# Patient Record
Sex: Female | Born: 1980 | Race: White | Hispanic: No | Marital: Married | Smoking: Never smoker
Health system: Southern US, Community
[De-identification: ages and names within clinical notes are randomized; demographics above are authoritative.]

## PROBLEM LIST (undated history)

## (undated) DIAGNOSIS — F909 Attention-deficit hyperactivity disorder, unspecified type: Secondary | ICD-10-CM

## (undated) DIAGNOSIS — O34219 Maternal care for unspecified type scar from previous cesarean delivery: Secondary | ICD-10-CM

## (undated) DIAGNOSIS — F419 Anxiety disorder, unspecified: Secondary | ICD-10-CM

## (undated) DIAGNOSIS — D649 Anemia, unspecified: Secondary | ICD-10-CM

## (undated) DIAGNOSIS — I493 Ventricular premature depolarization: Secondary | ICD-10-CM

## (undated) HISTORY — PX: WISDOM TOOTH EXTRACTION: SHX21

## (undated) HISTORY — PX: DILATION AND CURETTAGE OF UTERUS: SHX78

---

## 1999-02-12 ENCOUNTER — Other Ambulatory Visit: Admission: RE | Admit: 1999-02-12 | Discharge: 1999-02-12 | Payer: Self-pay | Admitting: Obstetrics and Gynecology

## 2003-11-24 ENCOUNTER — Emergency Department (HOSPITAL_COMMUNITY): Admission: EM | Admit: 2003-11-24 | Discharge: 2003-11-24 | Payer: Self-pay | Admitting: Emergency Medicine

## 2008-07-01 ENCOUNTER — Inpatient Hospital Stay (HOSPITAL_COMMUNITY): Admission: RE | Admit: 2008-07-01 | Discharge: 2008-07-04 | Payer: Self-pay | Admitting: Obstetrics and Gynecology

## 2010-04-11 ENCOUNTER — Inpatient Hospital Stay (HOSPITAL_COMMUNITY): Admission: AD | Admit: 2010-04-11 | Discharge: 2010-04-11 | Payer: Self-pay | Admitting: Obstetrics and Gynecology

## 2010-04-12 ENCOUNTER — Ambulatory Visit (HOSPITAL_COMMUNITY): Admission: AD | Admit: 2010-04-12 | Discharge: 2010-04-12 | Payer: Self-pay | Admitting: Obstetrics and Gynecology

## 2010-04-28 DEATH — deceased

## 2010-09-09 LAB — CBC
HCT: 39.8 % (ref 36.0–46.0)
Hemoglobin: 13.7 g/dL (ref 12.0–15.0)
MCH: 31.5 pg (ref 26.0–34.0)
MCHC: 34.3 g/dL (ref 30.0–36.0)
MCV: 91.8 fL (ref 78.0–100.0)
Platelets: 189 10*3/uL (ref 150–400)
RBC: 4.34 MIL/uL (ref 3.87–5.11)
RDW: 11.9 % (ref 11.5–15.5)
WBC: 6.4 10*3/uL (ref 4.0–10.5)

## 2010-09-09 LAB — WET PREP, GENITAL
Clue Cells Wet Prep HPF POC: NONE SEEN
Trich, Wet Prep: NONE SEEN
Yeast Wet Prep HPF POC: NONE SEEN

## 2010-09-09 LAB — GC/CHLAMYDIA PROBE AMP, GENITAL
Chlamydia, DNA Probe: NEGATIVE
GC Probe Amp, Genital: NEGATIVE

## 2010-09-09 LAB — ABO/RH: ABO/RH(D): O POS

## 2010-10-12 LAB — CBC
HCT: 30.3 % — ABNORMAL LOW (ref 36.0–46.0)
HCT: 34.7 % — ABNORMAL LOW (ref 36.0–46.0)
Hemoglobin: 10.2 g/dL — ABNORMAL LOW (ref 12.0–15.0)
Hemoglobin: 11.8 g/dL — ABNORMAL LOW (ref 12.0–15.0)
MCHC: 33.8 g/dL (ref 30.0–36.0)
MCHC: 33.9 g/dL (ref 30.0–36.0)
MCV: 91.9 fL (ref 78.0–100.0)
MCV: 93.6 fL (ref 78.0–100.0)
Platelets: 175 10*3/uL (ref 150–400)
Platelets: 207 10*3/uL (ref 150–400)
RBC: 3.24 MIL/uL — ABNORMAL LOW (ref 3.87–5.11)
RBC: 3.78 MIL/uL — ABNORMAL LOW (ref 3.87–5.11)
RDW: 13.1 % (ref 11.5–15.5)
RDW: 13.4 % (ref 11.5–15.5)
WBC: 12.9 10*3/uL — ABNORMAL HIGH (ref 4.0–10.5)
WBC: 7.8 10*3/uL (ref 4.0–10.5)

## 2010-10-12 LAB — RPR: RPR Ser Ql: NONREACTIVE

## 2010-12-23 LAB — ABO/RH: RH Type: POSITIVE

## 2010-12-23 LAB — GC/CHLAMYDIA PROBE AMP, GENITAL
Chlamydia: NEGATIVE
Gonorrhea: NEGATIVE

## 2010-12-23 LAB — HIV ANTIBODY (ROUTINE TESTING W REFLEX): HIV: NONREACTIVE

## 2010-12-23 LAB — RUBELLA ANTIBODY, IGM: Rubella: IMMUNE

## 2011-06-09 ENCOUNTER — Ambulatory Visit (INDEPENDENT_AMBULATORY_CARE_PROVIDER_SITE_OTHER): Payer: Commercial Managed Care - PPO | Admitting: *Deleted

## 2011-06-09 ENCOUNTER — Encounter (HOSPITAL_COMMUNITY): Payer: Self-pay

## 2011-06-09 ENCOUNTER — Inpatient Hospital Stay (HOSPITAL_COMMUNITY)
Admission: AD | Admit: 2011-06-09 | Discharge: 2011-06-09 | Disposition: A | Discharge status: Home / Self Care | Payer: Commercial Managed Care - PPO | Source: Ambulatory Visit | Attending: Obstetrics and Gynecology | Admitting: Obstetrics and Gynecology

## 2011-06-09 VITALS — BP 122/59

## 2011-06-09 DIAGNOSIS — O4700 False labor before 37 completed weeks of gestation, unspecified trimester: Secondary | ICD-10-CM | POA: Insufficient documentation

## 2011-06-09 DIAGNOSIS — O30009 Twin pregnancy, unspecified number of placenta and unspecified number of amniotic sacs, unspecified trimester: Secondary | ICD-10-CM

## 2011-06-09 HISTORY — DX: Anemia, unspecified: D64.9

## 2011-06-09 MED ORDER — BETAMETHASONE SOD PHOS & ACET 6 (3-3) MG/ML IJ SUSP
12.0000 mg | Freq: Once | INTRAMUSCULAR | Status: DC
  Filled 2011-06-09: qty 2

## 2011-06-09 MED ORDER — BETAMETHASONE SOD PHOS & ACET 6 (3-3) MG/ML IJ SUSP
12.0000 mg | Freq: Once | INTRAMUSCULAR | Status: AC
  Administered 2011-06-09: 12 mg via INTRAMUSCULAR
  Filled 2011-06-09: qty 2

## 2011-06-09 NOTE — Progress Notes (Signed)
Pt states here for betamethasone inj only, denies pain, bleeding or lof. +FM.

## 2011-06-09 NOTE — Progress Notes (Signed)
P = 88   Pt has appt w/ Dr. Renaldo Fiddler following NST today

## 2011-06-10 ENCOUNTER — Inpatient Hospital Stay (HOSPITAL_COMMUNITY)
Admission: AD | Admit: 2011-06-10 | Discharge: 2011-06-10 | Disposition: A | Discharge status: Home / Self Care | Payer: Commercial Managed Care - PPO | Source: Ambulatory Visit | Attending: Obstetrics and Gynecology | Admitting: Obstetrics and Gynecology

## 2011-06-10 DIAGNOSIS — O4700 False labor before 37 completed weeks of gestation, unspecified trimester: Secondary | ICD-10-CM | POA: Insufficient documentation

## 2011-06-10 MED ORDER — BETAMETHASONE SOD PHOS & ACET 6 (3-3) MG/ML IJ SUSP
12.0000 mg | Freq: Once | INTRAMUSCULAR | Status: AC
  Administered 2011-06-10: 12 mg via INTRAMUSCULAR
  Filled 2011-06-10: qty 2

## 2011-06-16 ENCOUNTER — Ambulatory Visit (INDEPENDENT_AMBULATORY_CARE_PROVIDER_SITE_OTHER): Payer: Commercial Managed Care - PPO | Admitting: *Deleted

## 2011-06-16 VITALS — BP 120/64

## 2011-06-16 DIAGNOSIS — O30009 Twin pregnancy, unspecified number of placenta and unspecified number of amniotic sacs, unspecified trimester: Secondary | ICD-10-CM

## 2011-06-16 NOTE — Progress Notes (Signed)
P = 84  Pt to see Dr. Renaldo Fiddler following NST today.

## 2011-06-23 ENCOUNTER — Encounter (HOSPITAL_COMMUNITY): Payer: Self-pay

## 2011-06-23 ENCOUNTER — Encounter (HOSPITAL_COMMUNITY)
Admission: AD | Disposition: A | Discharge status: Home / Self Care | Payer: Self-pay | Source: Ambulatory Visit | Attending: Obstetrics and Gynecology

## 2011-06-23 ENCOUNTER — Inpatient Hospital Stay (HOSPITAL_COMMUNITY)
Admission: AD | Admit: 2011-06-23 | Discharge: 2011-06-27 | DRG: 765 | Disposition: A | Discharge status: Home / Self Care | Payer: Commercial Managed Care - PPO | Source: Ambulatory Visit | Attending: Obstetrics and Gynecology | Admitting: Obstetrics and Gynecology

## 2011-06-23 ENCOUNTER — Encounter (HOSPITAL_COMMUNITY): Payer: Self-pay | Admitting: *Deleted

## 2011-06-23 ENCOUNTER — Ambulatory Visit (INDEPENDENT_AMBULATORY_CARE_PROVIDER_SITE_OTHER): Payer: Commercial Managed Care - PPO | Admitting: *Deleted

## 2011-06-23 ENCOUNTER — Other Ambulatory Visit: Payer: Self-pay | Admitting: Obstetrics and Gynecology

## 2011-06-23 ENCOUNTER — Inpatient Hospital Stay (HOSPITAL_COMMUNITY): Payer: Commercial Managed Care - PPO

## 2011-06-23 DIAGNOSIS — O30009 Twin pregnancy, unspecified number of placenta and unspecified number of amniotic sacs, unspecified trimester: Secondary | ICD-10-CM

## 2011-06-23 DIAGNOSIS — L02219 Cutaneous abscess of trunk, unspecified: Secondary | ICD-10-CM | POA: Diagnosis not present

## 2011-06-23 DIAGNOSIS — O3090 Multiple gestation, unspecified, unspecified trimester: Principal | ICD-10-CM | POA: Diagnosis present

## 2011-06-23 DIAGNOSIS — O909 Complication of the puerperium, unspecified: Secondary | ICD-10-CM | POA: Diagnosis not present

## 2011-06-23 DIAGNOSIS — Z98891 History of uterine scar from previous surgery: Secondary | ICD-10-CM

## 2011-06-23 LAB — CBC
HCT: 34.7 % — ABNORMAL LOW (ref 36.0–46.0)
MCH: 30 pg (ref 26.0–34.0)
MCHC: 33.4 g/dL (ref 30.0–36.0)
MCV: 89.7 fL (ref 78.0–100.0)
Platelets: 180 10*3/uL (ref 150–400)
RDW: 14.7 % (ref 11.5–15.5)

## 2011-06-23 SURGERY — Surgical Case
Anesthesia: Spinal | Site: Abdomen | Wound class: Clean Contaminated

## 2011-06-23 MED ORDER — MAGNESIUM SULFATE 40 G IN LACTATED RINGERS - SIMPLE
2.0000 g/h | INTRAVENOUS | Status: DC
  Administered 2011-06-23: 2 g/h via INTRAVENOUS
  Filled 2011-06-23: qty 500

## 2011-06-23 MED ORDER — MEPERIDINE HCL 25 MG/ML IJ SOLN: 6.2500 mg | INTRAMUSCULAR | Status: DC | PRN

## 2011-06-23 MED ORDER — NALBUPHINE SYRINGE 5 MG/0.5 ML
5.0000 mg | INJECTION | INTRAMUSCULAR | Status: DC | PRN
  Filled 2011-06-23: qty 1

## 2011-06-23 MED ORDER — DIPHENHYDRAMINE HCL 25 MG PO CAPS
25.0000 mg | ORAL_CAPSULE | ORAL | Status: DC | PRN
  Administered 2011-06-24 – 2011-06-25 (×2): 25 mg via ORAL
  Filled 2011-06-23 (×3): qty 1

## 2011-06-23 MED ORDER — DIPHENHYDRAMINE HCL 50 MG/ML IJ SOLN
12.5000 mg | INTRAMUSCULAR | Status: DC | PRN
  Administered 2011-06-23: 12.5 mg via INTRAVENOUS

## 2011-06-23 MED ORDER — LACTATED RINGERS IV SOLN
INTRAVENOUS | Status: DC | PRN
  Administered 2011-06-23 (×3): via INTRAVENOUS
  Administered 2011-06-23: 20:00:00

## 2011-06-23 MED ORDER — SODIUM CHLORIDE 0.9 % IJ SOLN: 3.0000 mL | INTRAMUSCULAR | Status: DC | PRN

## 2011-06-23 MED ORDER — OXYTOCIN 10 UNIT/ML IJ SOLN
INTRAMUSCULAR | Status: AC
  Filled 2011-06-23: qty 4

## 2011-06-23 MED ORDER — FERROUS SULFATE 325 (65 FE) MG PO TABS
325.0000 mg | ORAL_TABLET | Freq: Every day | ORAL | Status: DC
  Administered 2011-06-24 – 2011-06-27 (×4): 325 mg via ORAL
  Filled 2011-06-23 (×4): qty 1

## 2011-06-23 MED ORDER — OXYTOCIN 10 UNIT/ML IJ SOLN
INTRAMUSCULAR | Status: DC | PRN
  Administered 2011-06-23: 20 [IU]
  Administered 2011-06-23: 40 [IU]

## 2011-06-23 MED ORDER — PHENYLEPHRINE 40 MCG/ML (10ML) SYRINGE FOR IV PUSH (FOR BLOOD PRESSURE SUPPORT)
PREFILLED_SYRINGE | INTRAVENOUS | Status: AC
  Filled 2011-06-23: qty 5

## 2011-06-23 MED ORDER — MEPERIDINE HCL 25 MG/ML IJ SOLN
INTRAMUSCULAR | Status: DC | PRN
  Administered 2011-06-23 (×2): 12.5 mg via INTRAVENOUS

## 2011-06-23 MED ORDER — LANOLIN HYDROUS EX OINT: 1.0000 "application " | TOPICAL_OINTMENT | CUTANEOUS | Status: DC | PRN

## 2011-06-23 MED ORDER — TETANUS-DIPHTH-ACELL PERTUSSIS 5-2.5-18.5 LF-MCG/0.5 IM SUSP
0.5000 mL | Freq: Once | INTRAMUSCULAR | Status: DC
  Filled 2011-06-23: qty 0.5

## 2011-06-23 MED ORDER — SODIUM CHLORIDE 0.9 % IV SOLN
1.0000 ug/kg/h | INTRAVENOUS | Status: DC | PRN
  Filled 2011-06-23: qty 2.5

## 2011-06-23 MED ORDER — OXYTOCIN 10 UNIT/ML IJ SOLN
INTRAMUSCULAR | Status: AC
  Filled 2011-06-23: qty 2

## 2011-06-23 MED ORDER — EPHEDRINE 5 MG/ML INJ
INTRAVENOUS | Status: AC
  Filled 2011-06-23: qty 10

## 2011-06-23 MED ORDER — SIMETHICONE 80 MG PO CHEW
80.0000 mg | CHEWABLE_TABLET | Freq: Three times a day (TID) | ORAL | Status: DC
  Administered 2011-06-24 – 2011-06-27 (×7): 80 mg via ORAL

## 2011-06-23 MED ORDER — ONDANSETRON HCL 4 MG/2ML IJ SOLN: 4.0000 mg | INTRAMUSCULAR | Status: DC | PRN

## 2011-06-23 MED ORDER — ONDANSETRON HCL 4 MG PO TABS: 4.0000 mg | ORAL_TABLET | ORAL | Status: DC | PRN

## 2011-06-23 MED ORDER — FENTANYL CITRATE 0.05 MG/ML IJ SOLN
INTRAMUSCULAR | Status: DC | PRN
  Administered 2011-06-23 (×2): 25 ug via INTRAVENOUS
  Administered 2011-06-23: 25 ug via INTRATHECAL
  Administered 2011-06-23: 25 ug via INTRAVENOUS

## 2011-06-23 MED ORDER — CEFAZOLIN SODIUM 1-5 GM-% IV SOLN
INTRAVENOUS | Status: AC
  Filled 2011-06-23: qty 100

## 2011-06-23 MED ORDER — DIPHENHYDRAMINE HCL 25 MG PO CAPS: 25.0000 mg | ORAL_CAPSULE | Freq: Four times a day (QID) | ORAL | Status: DC | PRN

## 2011-06-23 MED ORDER — METOCLOPRAMIDE HCL 5 MG/ML IJ SOLN: 10.0000 mg | Freq: Three times a day (TID) | INTRAMUSCULAR | Status: DC | PRN

## 2011-06-23 MED ORDER — SIMETHICONE 80 MG PO CHEW
80.0000 mg | CHEWABLE_TABLET | ORAL | Status: DC | PRN
  Administered 2011-06-24: 80 mg via ORAL

## 2011-06-23 MED ORDER — METOCLOPRAMIDE HCL 5 MG/ML IJ SOLN: 10.0000 mg | Freq: Once | INTRAMUSCULAR | Status: DC | PRN

## 2011-06-23 MED ORDER — SENNOSIDES-DOCUSATE SODIUM 8.6-50 MG PO TABS
2.0000 | ORAL_TABLET | Freq: Every day | ORAL | Status: DC
  Administered 2011-06-24 – 2011-06-26 (×3): 2 via ORAL

## 2011-06-23 MED ORDER — KETOROLAC TROMETHAMINE 30 MG/ML IJ SOLN
30.0000 mg | Freq: Four times a day (QID) | INTRAMUSCULAR | Status: AC | PRN
  Administered 2011-06-23 – 2011-06-24 (×2): 30 mg via INTRAVENOUS
  Filled 2011-06-23: qty 1

## 2011-06-23 MED ORDER — NIFEDIPINE 10 MG PO CAPS
20.0000 mg | ORAL_CAPSULE | Freq: Once | ORAL | Status: AC
  Administered 2011-06-23: 20 mg via ORAL
  Filled 2011-06-23: qty 2

## 2011-06-23 MED ORDER — ACETAMINOPHEN 325 MG PO TABS: 650.0000 mg | ORAL_TABLET | ORAL | Status: DC | PRN

## 2011-06-23 MED ORDER — PRENATAL MULTIVITAMIN CH
1.0000 | ORAL_TABLET | Freq: Every day | ORAL | Status: DC
  Administered 2011-06-24 – 2011-06-27 (×4): 1 via ORAL
  Filled 2011-06-23 (×4): qty 1

## 2011-06-23 MED ORDER — WITCH HAZEL-GLYCERIN EX PADS
1.0000 "application " | MEDICATED_PAD | CUTANEOUS | Status: DC | PRN
  Administered 2011-06-27: 1 via TOPICAL

## 2011-06-23 MED ORDER — DEXTROSE IN LACTATED RINGERS 5 % IV SOLN
INTRAVENOUS | Status: DC
Start: 2011-06-24 — End: 2011-06-27
  Administered 2011-06-23 – 2011-06-24 (×2): via INTRAVENOUS

## 2011-06-23 MED ORDER — MENTHOL 3 MG MT LOZG: 1.0000 | LOZENGE | OROMUCOSAL | Status: DC | PRN

## 2011-06-23 MED ORDER — CEFAZOLIN SODIUM 1-5 GM-% IV SOLN
INTRAVENOUS | Status: DC | PRN
  Administered 2011-06-23: 2 g via INTRAVENOUS

## 2011-06-23 MED ORDER — CITRIC ACID-SODIUM CITRATE 334-500 MG/5ML PO SOLN
ORAL | Status: AC
  Administered 2011-06-23: 30 mL
  Filled 2011-06-23: qty 15

## 2011-06-23 MED ORDER — DIPHENHYDRAMINE HCL 50 MG/ML IJ SOLN: 25.0000 mg | INTRAMUSCULAR | Status: DC | PRN

## 2011-06-23 MED ORDER — MEDROXYPROGESTERONE ACETATE 150 MG/ML IM SUSP: 150.0000 mg | INTRAMUSCULAR | Status: DC | PRN

## 2011-06-23 MED ORDER — IBUPROFEN 600 MG PO TABS
600.0000 mg | ORAL_TABLET | Freq: Four times a day (QID) | ORAL | Status: DC
  Administered 2011-06-24 – 2011-06-27 (×12): 600 mg via ORAL
  Filled 2011-06-23 (×13): qty 1

## 2011-06-23 MED ORDER — NALOXONE HCL 0.4 MG/ML IJ SOLN: 0.4000 mg | INTRAMUSCULAR | Status: DC | PRN

## 2011-06-23 MED ORDER — MEPERIDINE HCL 25 MG/ML IJ SOLN
INTRAMUSCULAR | Status: AC
  Filled 2011-06-23: qty 1

## 2011-06-23 MED ORDER — OXYTOCIN 20 UNITS IN LACTATED RINGERS INFUSION - SIMPLE
125.0000 mL/h | INTRAVENOUS | Status: AC
  Filled 2011-06-23: qty 1000

## 2011-06-23 MED ORDER — DOCUSATE SODIUM 100 MG PO CAPS: 100.0000 mg | ORAL_CAPSULE | Freq: Every day | ORAL | Status: DC

## 2011-06-23 MED ORDER — MORPHINE SULFATE (PF) 0.5 MG/ML IJ SOLN
INTRAMUSCULAR | Status: DC | PRN
  Administered 2011-06-23: .15 mg via INTRATHECAL

## 2011-06-23 MED ORDER — MEASLES, MUMPS & RUBELLA VAC ~~LOC~~ INJ
0.5000 mL | INJECTION | Freq: Once | SUBCUTANEOUS | Status: DC
  Filled 2011-06-23: qty 0.5

## 2011-06-23 MED ORDER — PRENATAL MULTIVITAMIN CH: 1.0000 | ORAL_TABLET | Freq: Every day | ORAL | Status: DC

## 2011-06-23 MED ORDER — IBUPROFEN 600 MG PO TABS: 600.0000 mg | ORAL_TABLET | Freq: Four times a day (QID) | ORAL | Status: DC | PRN

## 2011-06-23 MED ORDER — FENTANYL CITRATE 0.05 MG/ML IJ SOLN
INTRAMUSCULAR | Status: AC
  Filled 2011-06-23: qty 2

## 2011-06-23 MED ORDER — ZOLPIDEM TARTRATE 10 MG PO TABS: 10.0000 mg | ORAL_TABLET | Freq: Every evening | ORAL | Status: DC | PRN

## 2011-06-23 MED ORDER — MAGNESIUM SULFATE BOLUS VIA INFUSION
4.0000 g | Freq: Once | INTRAVENOUS | Status: AC
  Administered 2011-06-23: 4 g via INTRAVENOUS
  Filled 2011-06-23: qty 500

## 2011-06-23 MED ORDER — MEPERIDINE HCL 25 MG/ML IJ SOLN
6.2500 mg | INTRAMUSCULAR | Status: DC | PRN
  Administered 2011-06-23: 6.25 mg via INTRAVENOUS

## 2011-06-23 MED ORDER — ONDANSETRON HCL 4 MG/2ML IJ SOLN
INTRAMUSCULAR | Status: AC
  Filled 2011-06-23: qty 2

## 2011-06-23 MED ORDER — MORPHINE SULFATE 0.5 MG/ML IJ SOLN
INTRAMUSCULAR | Status: AC
  Filled 2011-06-23: qty 10

## 2011-06-23 MED ORDER — KETOROLAC TROMETHAMINE 30 MG/ML IJ SOLN
INTRAMUSCULAR | Status: AC
  Filled 2011-06-23: qty 1

## 2011-06-23 MED ORDER — ONDANSETRON HCL 4 MG/2ML IJ SOLN
INTRAMUSCULAR | Status: DC | PRN
  Administered 2011-06-23: 4 mg via INTRAVENOUS

## 2011-06-23 MED ORDER — KETOROLAC TROMETHAMINE 30 MG/ML IJ SOLN: 30.0000 mg | Freq: Four times a day (QID) | INTRAMUSCULAR | Status: AC | PRN

## 2011-06-23 MED ORDER — OXYCODONE-ACETAMINOPHEN 5-325 MG PO TABS
1.0000 | ORAL_TABLET | ORAL | Status: DC | PRN
  Administered 2011-06-24 – 2011-06-25 (×5): 2 via ORAL
  Administered 2011-06-25: 1 via ORAL
  Administered 2011-06-25 – 2011-06-27 (×5): 2 via ORAL
  Administered 2011-06-27: 1 via ORAL
  Administered 2011-06-27: 2 via ORAL
  Filled 2011-06-23 (×6): qty 2
  Filled 2011-06-23: qty 1
  Filled 2011-06-23 (×3): qty 2
  Filled 2011-06-23: qty 1
  Filled 2011-06-23: qty 2
  Filled 2011-06-23 (×2): qty 1

## 2011-06-23 MED ORDER — SCOPOLAMINE 1 MG/3DAYS TD PT72
1.0000 | MEDICATED_PATCH | Freq: Once | TRANSDERMAL | Status: AC
  Administered 2011-06-23: 1.5 mg via TRANSDERMAL

## 2011-06-23 MED ORDER — ONDANSETRON HCL 4 MG/2ML IJ SOLN: 4.0000 mg | Freq: Three times a day (TID) | INTRAMUSCULAR | Status: DC | PRN

## 2011-06-23 MED ORDER — BUPIVACAINE IN DEXTROSE 0.75-8.25 % IT SOLN
INTRATHECAL | Status: DC | PRN
  Administered 2011-06-23: 1.6 mL via INTRATHECAL

## 2011-06-23 MED ORDER — DIBUCAINE 1 % RE OINT
1.0000 "application " | TOPICAL_OINTMENT | RECTAL | Status: DC | PRN
  Administered 2011-06-27: 1 via RECTAL
  Filled 2011-06-23: qty 28

## 2011-06-23 MED ORDER — SODIUM CHLORIDE 0.9 % IV SOLN
INTRAVENOUS | Status: DC
  Administered 2011-06-23: 15:00:00 via INTRAVENOUS

## 2011-06-23 MED ORDER — NALBUPHINE SYRINGE 5 MG/0.5 ML
5.0000 mg | INJECTION | INTRAMUSCULAR | Status: DC | PRN
  Administered 2011-06-24: 10 mg via SUBCUTANEOUS
  Filled 2011-06-23: qty 1

## 2011-06-23 MED ORDER — CALCIUM CARBONATE ANTACID 500 MG PO CHEW: 2.0000 | CHEWABLE_TABLET | ORAL | Status: DC | PRN

## 2011-06-23 MED ORDER — DIPHENHYDRAMINE HCL 50 MG/ML IJ SOLN
INTRAMUSCULAR | Status: AC
  Filled 2011-06-23: qty 1

## 2011-06-23 MED ORDER — SCOPOLAMINE 1 MG/3DAYS TD PT72
MEDICATED_PATCH | TRANSDERMAL | Status: AC
  Filled 2011-06-23: qty 1

## 2011-06-23 MED ORDER — FENTANYL CITRATE 0.05 MG/ML IJ SOLN: 25.0000 ug | INTRAMUSCULAR | Status: DC | PRN

## 2011-06-23 SURGICAL SUPPLY — 26 items
CHLORAPREP W/TINT 26ML (MISCELLANEOUS) ×2 IMPLANT
CLOTH BEACON ORANGE TIMEOUT ST (SAFETY) ×2 IMPLANT
DRSG COVADERM 4X8 (GAUZE/BANDAGES/DRESSINGS) ×1 IMPLANT
ELECT REM PT RETURN 9FT ADLT (ELECTROSURGICAL) ×2
ELECTRODE REM PT RTRN 9FT ADLT (ELECTROSURGICAL) ×1 IMPLANT
EXTRACTOR VACUUM M CUP 4 TUBE (SUCTIONS) IMPLANT
GLOVE BIO SURGEON STRL SZ 6.5 (GLOVE) ×2 IMPLANT
GLOVE BIOGEL PI IND STRL 7.0 (GLOVE) ×2 IMPLANT
GLOVE BIOGEL PI INDICATOR 7.0 (GLOVE) ×2
GOWN PREVENTION PLUS LG XLONG (DISPOSABLE) ×5 IMPLANT
KIT ABG SYR 3ML LUER SLIP (SYRINGE) ×1 IMPLANT
NDL HYPO 25X5/8 SAFETYGLIDE (NEEDLE) ×1 IMPLANT
NEEDLE HYPO 25X5/8 SAFETYGLIDE (NEEDLE) ×2 IMPLANT
NS IRRIG 1000ML POUR BTL (IV SOLUTION) ×2 IMPLANT
PACK C SECTION WH (CUSTOM PROCEDURE TRAY) ×2 IMPLANT
SLEEVE SCD COMPRESS KNEE MED (MISCELLANEOUS) IMPLANT
STAPLER VISISTAT 35W (STAPLE) ×2 IMPLANT
SUT CHROMIC 0 CT 802H (SUTURE) IMPLANT
SUT CHROMIC 0 CTX 36 (SUTURE) ×6 IMPLANT
SUT MNCRL AB 3-0 PS2 27 (SUTURE) IMPLANT
SUT MON AB-0 CT1 36 (SUTURE) ×2 IMPLANT
SUT PDS AB 0 CTX 60 (SUTURE) ×2 IMPLANT
SUT PLAIN 0 NONE (SUTURE) IMPLANT
TOWEL OR 17X24 6PK STRL BLUE (TOWEL DISPOSABLE) ×4 IMPLANT
TRAY FOLEY CATH 14FR (SET/KITS/TRAYS/PACK) ×1 IMPLANT
WATER STERILE IRR 1000ML POUR (IV SOLUTION) ×1 IMPLANT

## 2011-06-23 NOTE — Progress Notes (Signed)
Pt states went for NST today, noted ctx's q56minutes on monitor lasting 40 seconds. Dr. Vincente Poli checked pt, was 05, now 3cm.

## 2011-06-23 NOTE — Progress Notes (Signed)
P = 89   NST today- pt has appt w/Dr. Vincente Poli following NST

## 2011-06-23 NOTE — Progress Notes (Signed)
Pt continues to c/o painful contractions despite magnesium.  No vb or lof.    + FHT x 2 Toco Q2-3 Cvx 4/100/-2  Given contractions continue to be painful and cervical change, plan for c-section delivery.  R/b/a d/w patient and informed consent obtained.  All questions answered, pt agrees with plan of care

## 2011-06-23 NOTE — Transfer of Care (Signed)
Immediate Anesthesia Transfer of Care Note  Patient: Joyce Bauer  Procedure(s) Performed:  CESAREAN SECTION  Patient Location: PACU  Anesthesia Type: Spinal  Level of Consciousness: awake, alert  and oriented  Airway & Oxygen Therapy: Patient Spontanous Breathing  Post-op Assessment: Report given to PACU RN and Post -op Vital signs reviewed and stable  Post vital signs: Reviewed and stable  Complications: No apparent anesthesia complications

## 2011-06-23 NOTE — Anesthesia Preprocedure Evaluation (Signed)
Anesthesia Evaluation  Patient identified by MRN, date of birth, ID band Patient awake    Reviewed: Allergy & Precautions, H&P , Patient's Chart, lab work & pertinent test results  Airway Mallampati: II TM Distance: >3 FB Neck ROM: full    Dental No notable dental hx. (+) Teeth Intact   Pulmonary neg pulmonary ROS,  clear to auscultation  Pulmonary exam normal       Cardiovascular neg cardio ROS regular Normal    Neuro/Psych Negative Neurological ROS  Negative Psych ROS   GI/Hepatic negative GI ROS, Neg liver ROS,   Endo/Other  Negative Endocrine ROS  Renal/GU negative Renal ROS  Genitourinary negative   Musculoskeletal   Abdominal Normal abdominal exam  (+)   Peds  Hematology negative hematology ROS (+)   Anesthesia Other Findings   Reproductive/Obstetrics (+) Pregnancy                           Anesthesia Physical Anesthesia Plan  ASA: II and Emergent  Anesthesia Plan: Spinal   Post-op Pain Management:    Induction:   Airway Management Planned:   Additional Equipment:   Intra-op Plan:   Post-operative Plan:   Informed Consent: I have reviewed the patients History and Physical, chart, labs and discussed the procedure including the risks, benefits and alternatives for the proposed anesthesia with the patient or authorized representative who has indicated his/her understanding and acceptance.     Plan Discussed with: Anesthesiologist  Anesthesia Plan Comments:         Anesthesia Quick Evaluation

## 2011-06-23 NOTE — Anesthesia Procedure Notes (Signed)
Spinal  Patient location during procedure: OR Start time: 06/23/2011 7:54 PM Staffing Anesthesiologist: Reese Senk A. Performed by: anesthesiologist  Preanesthetic Checklist Completed: patient identified, site marked, surgical consent, pre-op evaluation, timeout performed, IV checked, risks and benefits discussed and monitors and equipment checked Spinal Block Patient position: sitting Prep: site prepped and draped and DuraPrep Patient monitoring: heart rate, cardiac monitor, continuous pulse ox and blood pressure Approach: midline Location: L3-4 Injection technique: single-shot Needle Needle type: Sprotte  Needle gauge: 24 G Needle length: 9 cm Needle insertion depth: 4 cm Assessment Sensory level: T4 Additional Notes Patient tolerated procedure well. Adequate sensory level.

## 2011-06-23 NOTE — Progress Notes (Signed)
MD in at [facility] and discussing POC.  Pt agree to a c/s.  MD reviewing risks and benefits

## 2011-06-23 NOTE — Progress Notes (Signed)
30 yo G4P1 w/ twins @ 34+[redacted] weeks gestation presents w/ contractions.  Ctx began today and have worsened since her cervical exam 1-2 hrs ago.  No vb or lof.  + FM x 2  AF, VSS  + FHT x 2 Toco - Q5-7 Gen - uncomfortable w/ ctx Abd - gravid, NT Cvx 3/80/-2 - no change from office exam  A/P:  PTL, twins Plan for dose of procardia - if ctx resolve will consider inpt bedrest/obs.  If labor - c-section for delivery

## 2011-06-23 NOTE — Consult Note (Signed)
Neonatology Note:   Attendance at C-section:    I was asked to attend this primary C/S at 34 5/7 weeks due to preterm labor, twin gestation, and second twin in breech presentation. The mother is a G4P1A2 O pos, GBS unknown with preterm labor. She received 2 doses of Betamethasone 1 weeks ago. She was 3 cm dilated this morning and had uterine contractions; she was treated with Procardia and admitted for observation. This afternoon, magnesium sulfate was started, but she continued to have contractions, so a C/S was done. ROM at delivery, fluid clear.   Twin A delivered vertex, cried spontaneously, and had some movement at birth. The clamp on the umbilical cord came off over the operating table and a small amount of blood was lost before another clamp could be put on. The baby cried well for the first 1-2 minutes, but did not pink up well, so BBO2 was given. By 3 minutes, he was retracting slightly and starting to grunt, so the neopuff was applied with improvement in symptoms and color. With the neopuff, good air exchange could be heard. His HR remained > 100 throughout. Ap 7/9. The O2 source was placed on blended O2 and he was transported to the NICU in 30% FIO2 after being viewed briefly by the parents in the OR. His father was in attendance.  Twin B was delivered breech and was vigorous at birth, pinking up quickly. He needed only bulb suctioning. Ap 9/9. Lungs clear to ausc in DR. He was held by his parents for 2-3 minutes in the OR, then was transported to the NICU in room air, looking pink en route. His father was in attendance.   Deatra James, MD

## 2011-06-23 NOTE — Consult Note (Signed)
Neonatology Consult to Antenatal Patient:  Ms. Gelber is admitted today at 29 5/[redacted] weeks GA with twin gestation, having contractions and 3 cm dilated. She received a dose of Procardia earlier, but then began to contract again. She got 2 doses of Betamethasone 1 week ago. The fetuses are both female, vertex and breech in position.  I spoke with the patient and her husband. We discussed the worst case of delivery in the next 1-2 days, including usual DR management, possible respiratory complications and need for support, IV access, feedings (mother desires breast feeding, which was encouraged), LOS, Mortality and Morbidity, and long term outcomes. They did not have any questions at this time. I offered a NICU tour to the father of the babies and would be glad to come back if they have more questions later.  Thank you for asking me to see this patient.  Deatra James, MD Neonatologist  Time spent: [phone]

## 2011-06-23 NOTE — Progress Notes (Signed)
To the or

## 2011-06-23 NOTE — H&P (Signed)
30 yo G4P1 w/ twins @ 34+[redacted] weeks gestation presents w/ contractions.  Ctx began today and have worsened since her cervical exam 1-2 hrs ago.  No vb or lof.  + FM x 2  Past history - see hollister  AF, VSS  + FHT x 2 Toco - Q5-7 Gen - uncomfortable w/ ctx Abd - gravid, NT Cvx 3/80/-2 - no change from office exam  A/P:  PTL, twins Plan for dose of procardia - if ctx resolve will consider inpt bedrest/obs.  If labor - c-section for delivery  Pt reports significant decrease in contractions since procardia.  Denies pain, lof or vb.  + FM x 2 Cvx - unchanged Plan - admit for bedrest, tocolysis.  Will consider Mag for neuroprophylaxis.

## 2011-06-23 NOTE — Progress Notes (Signed)
Pt reports contractions have restarted.  No vb or lof.    FHT reassuring x 2 Toco Q3-5 Cvx - unchanged, 3cm  A/P:  Will start magnesium for tocolysis and CP prophylaxis.  If ctx persist - plan for c-section.  Discussed plan w/ pt and husband.

## 2011-06-23 NOTE — Op Note (Signed)
Cesarean Section Procedure Note   ARRYANA TOLLESON  06/23/2011  Indications: twins, vtx/oblique presentation, PTL   Pre-operative Diagnosis: Twins, Preterm Labor.   Post-operative Diagnosis: Same   Surgeon: Surgeon(s) and Role:    Zelphia Cairo - Primary   Assistants: none  Anesthesia: spinal   Procedure Details:  The patient was seen in the Holding Room. The risks, benefits, complications, treatment options, and expected outcomes were discussed with the patient. The patient concurred with the proposed plan, giving informed consent. identified as Horton Finer and the procedure verified as C-Section Delivery. A Time Out was held and the above information confirmed.  After induction of anesthesia, the patient was draped and prepped in the usual sterile manner. A transverse was made and carried down through the subcutaneous tissue to the fascia. Fascial incision was made and extended transversely. The fascia was separated from the underlying rectus tissue superiorly and inferiorly. The peritoneum was identified and entered. Peritoneal incision was extended longitudinally. The utero-vesical peritoneal reflection was incised transversely and the bladder flap was bluntly freed from the lower uterine segment. A low transverse uterine incision was made. Delivered from cephalic presentation was baby A.   Cord ph was sent the umbilical cord was clamped and cut cord blood was obtained for evaluation. Baby B was delivered using standard breech maneuvers. Cord pH was sent. The placenta was removed Intact and appeared normal. The uterine outline, tubes and ovaries appeared normal}. The uterine incision was closed with running locked sutures of 0chromic gut.   Hemostasis was observed. Lavage was carried out until clear  Peritoneum was closed with monocryl.  . The fascia was then reapproximated with running sutures of 0PDS.The skin was closed with Vicryl.   Dermabond was placed over the  incision  Instrument, sponge, and needle counts were correct prior the abdominal closure and were correct at the conclusion of the case.    Findings:   Estimated Blood Loss: 1100cc    Urine Output: 300CC OF clear urine  Specimens: placenta  Complications: no complications  Disposition: PACU - hemodynamically stable.   Maternal Condition: stable   Baby condition / location:  NICU  Attending Attestation: I was present and scrubbed for the entire procedure.   Signed: Surgeon(s): Zelphia Cairo

## 2011-06-23 NOTE — Progress Notes (Signed)
This note also relates to the following rows which could not be included: SpO2 - Cannot attach notes to rows marked as read only

## 2011-06-23 NOTE — Anesthesia Postprocedure Evaluation (Signed)
  Anesthesia Post-op Note  Patient: Joyce Bauer  Procedure(s) Performed:  CESAREAN SECTION  Patient Location: PACU  Anesthesia Type: Spinal  Level of Consciousness: awake, alert  and oriented  Airway and Oxygen Therapy: Patient Spontanous Breathing  Post-op Pain: none  Post-op Assessment: Post-op Vital signs reviewed, Patient's Cardiovascular Status Stable, Respiratory Function Stable, Patent Airway, No signs of Nausea or vomiting, Pain level controlled, No headache and No backache  Post-op Vital Signs: Reviewed and stable  Complications: No apparent anesthesia complications

## 2011-06-24 LAB — CBC
HCT: 29.6 % — ABNORMAL LOW (ref 36.0–46.0)
MCH: 29.7 pg (ref 26.0–34.0)
MCHC: 32.8 g/dL (ref 30.0–36.0)
RDW: 14.7 % (ref 11.5–15.5)

## 2011-06-24 NOTE — Progress Notes (Signed)
Subjective: Postpartum Day 1: Cesarean Delivery Patient reports incisional pain and tolerating PO.   Babies stable in NICU - one on CPAP. Objective: Vital signs in last 24 hours: Temp:  [97.6 F (36.4 C)-99 F (37.2 C)] 99 F (37.2 C) (12/27 0621) Pulse Rate:  [74-101] 74  (12/27 0621) Resp:  [16-31] 18  (12/27 0621) BP: (92-129)/(45-75) 108/63 mmHg (12/27 0621) SpO2:  [94 %-100 %] 96 % (12/27 0621) Weight:  [91.173 kg (201 lb)] 201 lb (91.173 kg) (12/26 1117)  Physical Exam:  General: alert, cooperative and appears stated age Lochia: appropriate Uterine Fundus: firm Incision: healing well, no significant drainage, no dehiscence, no significant erythema DVT Evaluation: No evidence of DVT seen on physical exam.   Basename 06/24/11 0555 06/23/11 1915  HGB 9.7* 11.6*  HCT 29.6* 34.7*    Assessment/Plan: Status post Cesarean section. Doing well postoperatively.  Continue current care.  Zalia Hautala L 06/24/2011, 7:57 AM

## 2011-06-24 NOTE — Progress Notes (Signed)
PSYCHOSOCIAL ASSESSMENT ~ MATERNAL/CHILD Name: Joyce Bauer and Joyce Bauer                                                                      Age: 32 day   Referral Date: 06/24/11 Reason/Source: NICU support  I. FAMILY/HOME ENVIRONMENT A. Child's Legal Guardian _x__Parent(s) ___Grandparent ___Foster parent ___DSS_________________ Name: Joyce Bauer                                          DOB: [birth year]                     Age: 4  Address: [address], [place], [place] [address]  Name: Joyce Bauer                                             DOB: //                     Age:   Address: same  B. Other Household Members/Support Persons Name: Joyce Bauer (3)                             Relationship: sister             DOB 07/02/08                   Name:                                         Relationship:                        DOB ___/___/___                   Name:                                         Relationship:                        DOB ___/___/___                   Name:                                         Relationship:                        DOB ___/___/___  C. Other Support: good support from a "large family."   II. PSYCHOSOCIAL DATA A. Information Source  _x_Patient Interview  _x_Family Interview           _x_Other: chart  B. Event organiser _x_Employment: [employer], [name]-PA at [employer] __Medicaid    [place]:                 _x_Private Insurance: [employer]                   __Self Pay  __Food Stamps   __WIC __[employer]     __Public Housing     __Section 8    __Maternity Care Coordination/Child Service Coordination/Early Intervention  __School:                                                                         Grade:  __Other:   Priscille Kluver and Environment Information Cultural Issues Impacting Care: none known  III. STRENGTHS _x__Supportive  family/friends _x__Adequate Resources _x__Compliance with medical plan _x__Home prepared for Child (including basic supplies) _x__Understanding of illness      _x__Other: Ermalinda Barrios will be the babies' pediatrician IV. RISK FACTORS AND CURRENT PROBLEMS         __x__No Problems Noted                                                                                                                                                                                                                                       Pt              Family     Substance Abuse                                                                ___              ___        Mental Illness  ___              ___  Family/Relationship Issues                                      ___               ___             Abuse/Neglect/Domestic Violence                                         ___         ___  Financial Resources                                        ___              ___             Transportation                                                                        ___               ___  DSS Involvement                                                                   ___              ___  Adjustment to Illness                                                               ___              ___  Knowledge/Cognitive Deficit                                                   ___              ___             Compliance with Treatment                                                 ___                ___  Basic Needs (food, housing, etc.)                                          ___              ___             Housing Concerns                                       ___              ___ Other_____________________________________________________________            V. SOCIAL WORK ASSESSMENT SW met with [name] to introduce myself, complete assessment and evaluate how  family is coping with babies' admissions to NICU.  [name] seems to be doing very well and calm about the situation.  She states she has a good support system and everything she needs for baby at home.  She has numerous family members who are taking care of her daughter while she is in the hospital.  She plans to go back to work in 12-16 weeks.  She states her husband is already back to work because she wants him to take time off when the babies go home.  SW explained support services offered by NICU SWs and gave contact information.  [name] states no questions or needs at this time.    VI. SOCIAL WORK PLAN  ___No Further Intervention Required/No Barriers to Discharge   __x_Psychosocial Support and Ongoing Assessment of Needs   ___Patient/Family Education:   ___Child Protective Services Report   County___________ Date___/____/____   ___Information/Referral to [employer]_________________________   ___Other:

## 2011-06-24 NOTE — Anesthesia Postprocedure Evaluation (Signed)
  Anesthesia Post Note  Patient: Joyce Bauer  Procedure(s) Performed:  CESAREAN SECTION  Anesthesia type: Spinal  Patient location: Women's Unit  Post pain: Pain level controlled  Post assessment: Post-op Vital signs reviewed  Last Vitals:  Filed Vitals:   06/24/11 0621  BP: 108/63  Pulse: 74  Temp: 37.2 C  Resp: 18    Post vital signs: Reviewed  Level of consciousness: awake  Complications: No apparent anesthesia complications

## 2011-06-24 NOTE — Addendum Note (Signed)
Addendum  created 06/24/11 1610 by Cephus Shelling   Modules edited:Notes Section

## 2011-06-24 NOTE — Progress Notes (Signed)
UR Chart review completed.  

## 2011-06-25 NOTE — Progress Notes (Signed)
Subjective: Postpartum Day two: Cesarean Delivery Patient reports incisional pain.    Objective: Vital signs in last 24 hours: Temp:  [97.9 F (36.6 C)-98.3 F (36.8 C)] 98.1 F (36.7 C) (12/28 0535) Pulse Rate:  [71-80] 75  (12/28 0535) Resp:  [18-20] 18  (12/28 0535) BP: (91-113)/(52-70) 113/69 mmHg (12/28 0535) SpO2:  [96 %-100 %] 99 % (12/28 0535) Weight:  [91.173 kg (201 lb)] 201 lb (91.173 kg) (12/27 1622)  Physical Exam:  General: alert Lochia: appropriate Uterine Fundus: firm Incision: healing well DVT Evaluation: No evidence of DVT seen on physical exam.   Basename 06/24/11 0555 06/23/11 1915  HGB 9.7* 11.6*  HCT 29.6* 34.7*    Assessment/Plan: Status post Cesarean section. Doing well postoperatively.  Continue current care.  Joyce Bauer S 06/25/2011, 9:34 AM

## 2011-06-26 ENCOUNTER — Encounter (HOSPITAL_COMMUNITY): Payer: Self-pay | Admitting: Obstetrics and Gynecology

## 2011-06-26 MED ORDER — AMOXICILLIN-POT CLAVULANATE 500-125 MG PO TABS
500.0000 mg | ORAL_TABLET | Freq: Three times a day (TID) | ORAL | Status: DC
  Administered 2011-06-26 – 2011-06-27 (×4): 500 mg via ORAL
  Filled 2011-06-26 (×4): qty 1

## 2011-06-26 NOTE — Plan of Care (Signed)
Problem: Phase II Progression Outcomes Goal: Pain controlled on oral analgesia Outcome: Completed/Met Date Met:  06/26/11 Patient has good pain control on po Percocet and atc Motrin Goal: Progress activity as tolerated unless otherwise ordered Outcome: Completed/Met Date Met:  06/26/11 Patient is up and ambulating well down to NICU to see the twins Goal: Afebrile, VS remain stable Outcome: Progressing Vital signs are stable at this time Goal: Incision intact & without signs/symptoms of infection Outcome: Progressing Incision does appear to be a little red so MD has placed patient on po Antibiotics. Goal: Tolerating diet Outcome: Completed/Met Date Met:  06/26/11 Doing well with Regular diet and has had a good BM today. Goal: Other Phase II Outcomes/Goals Outcome: Progressing Good pain control with activity Postpartum education done  What to call the MD for  Problem: Discharge Progression Outcomes Goal: Activity appropriate for discharge plan Outcome: Completed/Met Date Met:  06/26/11 Patient is independent and moves well Goal: Tolerating diet Outcome: Completed/Met Date Met:  06/26/11 Doing well on Regular diet

## 2011-06-26 NOTE — Progress Notes (Signed)
Subjective: Postpartum Day three : Cesarean Delivery Patient reports nausea.  Incisional pain  Objective: Vital signs in last 24 hours: Temp:  [97.5 F (36.4 C)-98.4 F (36.9 C)] 97.5 F (36.4 C) (12/29 0520) Pulse Rate:  [75-83] 75  (12/29 0520) Resp:  [18] 18  (12/29 0520) BP: (102-111)/(65-66) 111/66 mmHg (12/29 0520) SpO2:  [98 %-100 %] 100 % (12/29 0520)  Physical Exam:  General: alert Lochia: appropriate Uterine Fundus: firm Incision: intact with slight erythema  DVT Evaluation: No evidence of DVT seen on physical exam.   Basename 06/24/11 0555 06/23/11 1915  HGB 9.7* 11.6*  HCT 29.6* 34.7*    Assessment/Plan: Status post Cesarean section. Postoperative course complicated by incisional cellulitis  Po augmentin.  Joyce Bauer S 06/26/2011, 10:32 AM

## 2011-06-27 MED ORDER — AMOXICILLIN-POT CLAVULANATE 500-125 MG PO TABS: 1.0000 | ORAL_TABLET | Freq: Three times a day (TID) | ORAL | Status: AC

## 2011-06-27 MED ORDER — IBUPROFEN 600 MG PO TABS: 600.0000 mg | ORAL_TABLET | Freq: Four times a day (QID) | ORAL | Status: AC | PRN

## 2011-06-27 MED ORDER — OXYCODONE-ACETAMINOPHEN 5-500 MG PO CAPS: 1.0000 | ORAL_CAPSULE | ORAL | Status: AC | PRN

## 2011-06-27 NOTE — Discharge Summary (Signed)
  Patient name  Maitlyn Penza DICTATION#  [id number] CSN# [id number]  Juluis Mire, MD 06/27/2011 11:25 AM

## 2011-06-27 NOTE — Progress Notes (Signed)
Pt d/c home with family via ambulatory in private car. D/c instructions reviewed with pt family verbalized understanding.

## 2011-06-27 NOTE — Plan of Care (Signed)
Problem: Discharge Progression Outcomes Goal: Pain controlled with appropriate interventions Outcome: Completed/Met Date Met:  06/27/11 Patient will go home probably on Motrin and Percocet which works very well for her pain control Goal: Remove staples per MD order Outcome: Not Applicable Date Met:  06/27/11 Incision has Dermabond as a closure

## 2011-06-27 NOTE — Progress Notes (Signed)
Subjective: Postpartum Day four: Cesarean Delivery Patient reports tolerating PO.    Objective: Vital signs in last 24 hours: Temp:  [97.8 F (36.6 C)-98.4 F (36.9 C)] 98.4 F (36.9 C) (12/30 0611) Pulse Rate:  [67-78] 72  (12/30 0611) Resp:  [16-18] 16  (12/30 0611) BP: (107-115)/(68-72) 107/70 mmHg (12/30 0611) SpO2:  [97 %-98 %] 98 % (12/30 1610)  Physical Exam:  General: alert Lochia: appropriate Uterine Fundus: firm Incision: less erythematous and tender DVT Evaluation: No evidence of DVT seen on physical exam.  No results found for this basename: HGB:2,HCT:2 in the last 72 hours  Assessment/Plan: Status post Cesarean section. Doing well postoperatively.  Discharge home with standard precautions and return to clinic in 4-6 weeks.  Joyce Bauer S 06/27/2011, 11:24 AM

## 2011-06-30 ENCOUNTER — Other Ambulatory Visit: Payer: Commercial Managed Care - PPO

## 2011-06-30 NOTE — Discharge Summary (Signed)
Joyce Bauer, Joyce Bauer              ACCOUNT NO.:  [id number]  MEDICAL RECORD NO.:  [id number]  LOCATION:                                 FACILITY:  PHYSICIAN:  Juluis Mire, M.D.   DATE OF BIRTH:  [birth year]  DATE OF ADMISSION: DATE OF DISCHARGE:                              DISCHARGE SUMMARY   ADMITTING DIAGNOSIS:  Twin pregnancy at 34-5/7 with spontaneous onset of labor.  DISCHARGE DIAGNOSIS:  Twin pregnancy at 34-5/7 with spontaneous onset of labor with lack of tocolysis leading to a low transverse cesarean section.  OPERATIVE PROCEDURE:  Low transverse cesarean section.  For a complete history and physical, please see written note.  COURSE IN HOSPITAL:  The patient was brought in, attempts at tocolysis was undertaken.  Despite this, the patient continued to have uterine activity with progressive change.  Because of this, she subsequently underwent a primary cesarean section with delivery of a viable twin males.  They actually did well in the Neonatal Intensive Care Unit. Both were on just room air at the present time.  Mother did well postop, did develop a little incisional cellulitis that was treated with Augmentin with good response.  Her postop hemoglobin was 9.7 and she was discharged home on her 4th postoperative day.  At that time, she was afebrile with stable vital signs.  She was tolerating her diet and ambulating without difficulty.  She had normal bowel and bladder function.  Her abdominal exam revealed the fundus to be firm.  Incision was clear.  Previous signs of cellulitis were resolving under Augmentin. Lochia was normal.  In terms of complications, none were encountered during the stay in the hospital.  The patient discharged home in stable condition.  DISPOSITION:  The patient is to avoid heavy lifting, vaginal entrance, or driving a car.  She is instructed to call with signs of infection, nausea, vomiting, increasing abdominal pain or active vaginal  bleeding. Discharged home on Percocet she needs for pain, Motrin as well as a short course of Augmentin.  PLAN:  Follow up in the office in 1 week.     Juluis Mire, M.D.     JSM/MEDQ  D:  06/27/2011  T:  06/27/2011  Job:  [id number]

## 2011-07-16 ENCOUNTER — Ambulatory Visit (HOSPITAL_COMMUNITY): Payer: Commercial Managed Care - PPO

## 2011-07-19 ENCOUNTER — Encounter (HOSPITAL_COMMUNITY): Payer: Self-pay

## 2011-07-19 ENCOUNTER — Encounter (HOSPITAL_COMMUNITY): Payer: Commercial Managed Care - PPO

## 2011-07-19 ENCOUNTER — Inpatient Hospital Stay (HOSPITAL_COMMUNITY): Admit: 2011-07-19 | Payer: Commercial Managed Care - PPO | Admitting: Obstetrics and Gynecology

## 2011-07-19 SURGERY — Surgical Case
Anesthesia: Regional

## 2011-07-20 ENCOUNTER — Ambulatory Visit (HOSPITAL_COMMUNITY)
Admission: RE | Admit: 2011-07-20 | Discharge: 2011-07-20 | Disposition: A | Discharge status: Home / Self Care | Payer: Commercial Managed Care - PPO | Source: Ambulatory Visit | Attending: Obstetrics and Gynecology | Admitting: Obstetrics and Gynecology

## 2011-07-20 NOTE — Progress Notes (Signed)
Adult Lactation Consultation Outpatient Visit Note  Patient Name: Joyce Bauer, twin A Date of Birth: 06-Feb-1981                            Gestational Age at Delivery:34 5/7     Now 38 4/7 corrected gestation Type of Delivery:                                          DOB 12/26 2014          c-section  Breastfeeding History: Frequency of Breastfeeding: breast feeding 3 times a day, other feeds bottles of expressed breast milk Length of Feeding: 15 mins Voids: 10 Stools: 6  Supplementing / Method: Pumping:  Type of Pump:Medela DEP freestyle   Frequency:every 3 hours  Volume:  220 Comments:220 times 8 =1760 mls per day    Consultation Evaluation:  Initial Feeding Assessment: Pre-feed ZOXWRU:0454 Post-feed UJWJXB:1478 Amount Transferred:22 Comments:unlatched due to choking . Mom needed assistance latching baby without pain on right breast. Showed mom how to hold baby behind his back, and wait for wide mouth. Mom states no pain this way  Additional Feeding Assessment: Pre-feed GNFAOZ:3086 Post-feed VHQION:6295 Amount Transferred:12 Comments:Mom able to latch independently this time  Additional Feeding Assessment: Pre-feed MWUXLK:4401 Post-feed UUVOZD:6644 Amount Transferred: Comments:needed to pull bottom lip down after latch Total Breast milk Transferred this Visit:  Total Supplement Given: 44 ml in 35 mins at breast  Additional Interventions: Mom will continue to breast feed Jagger twice a day, and relatch multiple times during a feed, to try and transfer as close to what he takes from a bottle, which is 60-80 mls.   Follow-Up   Mom will come back for additional outpatient visit when baby is term corrected gestation term.      Alfred Levins 07/20/2011, 11:47 AM

## 2011-07-20 NOTE — Progress Notes (Addendum)
Adult Lactation Consultation Outpatient Visit Note  Patient Name: BARRETT HOLTHAUS                     Baby Verdis Prime Date of Birth: 1981-06-13                                 DOB 06/23/2013 Gestational Age at Delivery:                         34 4/7         Type of Delivery: c-Section  Breastfeeding History: Frequency of Breastfeeding: Due to painful latch, chase has been bottle fed Expressed breast milk, 100 ml every 3 hours Length of Feeding:  Voids: 10 Stools: 6  Supplementing / Method: Pumping:  Type of Pump:DEP Medela freestyle   Frequency:every 3 hours  Volume:  220 mls, 8 times a day =1760 per day  Comments:    Consultation Evaluation:  Initial Feeding Assessment: Pre-feed Weight:3218 Post-feed WUJWJX:9147 Amount Transferred:20 Comments:latch was painful for mom - nipple being pinched  Additional Feeding Assessment: Pre-feed Weight:3238 Post-feed WGNFAO:1308 Amount Transferred:14 Comments:Latched with size 24 nipple shield - latched baby in cross-cradle with no pain  Additional Feeding Assessment: Pre-feed MVHQIO:9629 Post-feed Weight: Amount Transferred: Comments:  Total Breast milk Transferred this Visit: 34 Total Supplement Given: supplemented with bottle expressed breast milk  Additional Interventions:Mom will attempt latching baby with nipple shield, about once a day   Follow-Up Outpatient visit when baby is ter corrected gestation      Alfred Levins 07/20/2011, 12:39 PM

## 2011-09-08 ENCOUNTER — Other Ambulatory Visit (HOSPITAL_COMMUNITY): Payer: Self-pay | Admitting: Obstetrics and Gynecology

## 2011-09-08 DIAGNOSIS — O30009 Twin pregnancy, unspecified number of placenta and unspecified number of amniotic sacs, unspecified trimester: Secondary | ICD-10-CM

## 2011-09-08 DIAGNOSIS — T8189XA Other complications of procedures, not elsewhere classified, initial encounter: Secondary | ICD-10-CM

## 2011-09-08 DIAGNOSIS — R1031 Right lower quadrant pain: Secondary | ICD-10-CM

## 2011-09-10 ENCOUNTER — Ambulatory Visit (HOSPITAL_COMMUNITY)
Admission: RE | Admit: 2011-09-10 | Discharge: 2011-09-10 | Disposition: A | Discharge status: Home / Self Care | Payer: Commercial Managed Care - PPO | Source: Ambulatory Visit | Attending: Obstetrics and Gynecology | Admitting: Obstetrics and Gynecology

## 2011-09-10 DIAGNOSIS — R1031 Right lower quadrant pain: Secondary | ICD-10-CM

## 2011-09-10 DIAGNOSIS — T8189XA Other complications of procedures, not elsewhere classified, initial encounter: Secondary | ICD-10-CM

## 2011-09-10 DIAGNOSIS — O30009 Twin pregnancy, unspecified number of placenta and unspecified number of amniotic sacs, unspecified trimester: Secondary | ICD-10-CM

## 2011-09-10 MED ORDER — IOHEXOL 300 MG/ML  SOLN
100.0000 mL | Freq: Once | INTRAMUSCULAR | Status: AC | PRN
  Administered 2011-09-10: 100 mL via INTRAVENOUS

## 2012-06-23 LAB — OB RESULTS CONSOLE GC/CHLAMYDIA: Gonorrhea: NEGATIVE

## 2012-06-23 LAB — OB RESULTS CONSOLE HIV ANTIBODY (ROUTINE TESTING): HIV: NONREACTIVE

## 2012-06-23 LAB — OB RESULTS CONSOLE RPR: RPR: NONREACTIVE

## 2012-06-23 LAB — OB RESULTS CONSOLE RUBELLA ANTIBODY, IGM: Rubella: IMMUNE

## 2012-06-28 HISTORY — DX: Maternal care for unspecified type scar from previous cesarean delivery: O34.219

## 2012-06-28 NOTE — L&D Delivery Note (Signed)
Delivery Note At 3:06 PM a viable and healthy female was delivered via  (Presentation: ROA  ).  APGAR: 9,9 ; weight pending .   Placenta status: Intact, Spontaneous.  Cord: Sawpit x one reduced  with the following complications: none .  Cord pH: na  Anesthesia:  epidural Episiotomy: none Lacerations: none Suture Repair: none Est. Blood Loss (mL): 200  Mom to postpartum.  Baby to nursery-stable.  Luva Metzger J 02/06/2013, 3:20 PM

## 2012-08-14 ENCOUNTER — Other Ambulatory Visit: Payer: Self-pay

## 2012-08-31 ENCOUNTER — Other Ambulatory Visit (HOSPITAL_COMMUNITY): Payer: Self-pay | Admitting: Obstetrics and Gynecology

## 2012-08-31 DIAGNOSIS — Z3689 Encounter for other specified antenatal screening: Secondary | ICD-10-CM

## 2012-09-06 ENCOUNTER — Encounter (HOSPITAL_COMMUNITY): Payer: Self-pay

## 2012-09-06 ENCOUNTER — Ambulatory Visit (HOSPITAL_COMMUNITY)
Admission: RE | Admit: 2012-09-06 | Discharge: 2012-09-06 | Disposition: A | Discharge status: Home / Self Care | Payer: Managed Care, Other (non HMO) | Source: Ambulatory Visit | Attending: Obstetrics and Gynecology | Admitting: Obstetrics and Gynecology

## 2012-09-06 ENCOUNTER — Ambulatory Visit (HOSPITAL_COMMUNITY): Admission: RE | Admit: 2012-09-06 | Payer: Managed Care, Other (non HMO) | Source: Ambulatory Visit

## 2012-09-06 VITALS — BP 101/57 | HR 76 | Wt 167.2 lb

## 2012-09-06 DIAGNOSIS — O358XX0 Maternal care for other (suspected) fetal abnormality and damage, not applicable or unspecified: Secondary | ICD-10-CM

## 2012-09-06 DIAGNOSIS — Z8751 Personal history of pre-term labor: Secondary | ICD-10-CM | POA: Insufficient documentation

## 2012-09-06 DIAGNOSIS — Z3689 Encounter for other specified antenatal screening: Secondary | ICD-10-CM

## 2012-09-06 DIAGNOSIS — Z363 Encounter for antenatal screening for malformations: Secondary | ICD-10-CM | POA: Insufficient documentation

## 2012-09-06 DIAGNOSIS — Z1389 Encounter for screening for other disorder: Secondary | ICD-10-CM | POA: Insufficient documentation

## 2012-09-06 NOTE — Progress Notes (Signed)
Joyce Bauer  was seen today for an ultrasound appointment.  See full report in [facility].  Comments: Ms. Liskey was seen today due to concerns of a spinal abnormality (possible absent L1 vertebrae).  The patient's prenatal course has been uncomplicated.  MSAFP was within normal limits.  The fetal spine was well-visualized in both longitudal and transverse views and appears normal.  The fetal cranial anatomy including cerebellum, cisterna magna and the lateral ventricles appear normal.  The renal pelvi measure approximately 3 mm biltarally (essentially physiologic - does note meet criteria for renal pylectasis).  The fetal anatomy otherwise appears normal.  Findings and limitations of the study were reviewed with the patient.  Impression: Single IUP at 19 5/7 weeks Normal fetal anatomic survey - the spine/cranial anatomy appear normal No markers associated with aneuploidy noted Normal amniotic fluid volume  Recommendations: Recommend follow-up ultrasound examination in 6 weeks - will reevaluate the fetal spine and kidneys at that time.  Alpha Gula, MD

## 2012-10-18 ENCOUNTER — Ambulatory Visit (HOSPITAL_COMMUNITY): Payer: Managed Care, Other (non HMO) | Attending: Obstetrics and Gynecology

## 2012-12-25 LAB — OB RESULTS CONSOLE GBS: GBS: NEGATIVE

## 2013-01-26 ENCOUNTER — Inpatient Hospital Stay (HOSPITAL_COMMUNITY): Admission: AD | Admit: 2013-01-26 | Payer: Self-pay | Source: Ambulatory Visit | Admitting: Obstetrics and Gynecology

## 2013-01-30 ENCOUNTER — Telehealth (HOSPITAL_COMMUNITY): Payer: Self-pay | Admitting: *Deleted

## 2013-01-30 ENCOUNTER — Encounter (HOSPITAL_COMMUNITY): Payer: Self-pay | Admitting: *Deleted

## 2013-01-30 NOTE — Telephone Encounter (Signed)
Preadmission screen  

## 2013-02-05 ENCOUNTER — Other Ambulatory Visit: Payer: Self-pay | Admitting: Obstetrics and Gynecology

## 2013-02-06 ENCOUNTER — Encounter (HOSPITAL_COMMUNITY): Payer: Self-pay | Admitting: Anesthesiology

## 2013-02-06 ENCOUNTER — Inpatient Hospital Stay (HOSPITAL_COMMUNITY)
Admission: RE | Admit: 2013-02-06 | Discharge: 2013-02-08 | DRG: 775 | Disposition: A | Discharge status: Home / Self Care | Payer: Managed Care, Other (non HMO) | Source: Ambulatory Visit | Attending: Obstetrics and Gynecology | Admitting: Obstetrics and Gynecology

## 2013-02-06 ENCOUNTER — Encounter (HOSPITAL_COMMUNITY): Payer: Self-pay

## 2013-02-06 ENCOUNTER — Inpatient Hospital Stay (HOSPITAL_COMMUNITY): Payer: Managed Care, Other (non HMO) | Admitting: Anesthesiology

## 2013-02-06 DIAGNOSIS — O34219 Maternal care for unspecified type scar from previous cesarean delivery: Secondary | ICD-10-CM | POA: Diagnosis not present

## 2013-02-06 DIAGNOSIS — O480 Post-term pregnancy: Principal | ICD-10-CM | POA: Diagnosis present

## 2013-02-06 LAB — RPR: RPR Ser Ql: NONREACTIVE

## 2013-02-06 LAB — CBC
Hemoglobin: 12.5 g/dL (ref 12.0–15.0)
Platelets: 196 10*3/uL (ref 150–400)
RBC: 4.22 MIL/uL (ref 3.87–5.11)
WBC: 10.2 10*3/uL (ref 4.0–10.5)

## 2013-02-06 MED ORDER — SIMETHICONE 80 MG PO CHEW: 80.0000 mg | CHEWABLE_TABLET | ORAL | Status: DC | PRN

## 2013-02-06 MED ORDER — METHYLERGONOVINE MALEATE 0.2 MG/ML IJ SOLN: 0.2000 mg | INTRAMUSCULAR | Status: DC | PRN

## 2013-02-06 MED ORDER — METHYLERGONOVINE MALEATE 0.2 MG PO TABS: 0.2000 mg | ORAL_TABLET | ORAL | Status: DC | PRN

## 2013-02-06 MED ORDER — ZOLPIDEM TARTRATE 5 MG PO TABS: 5.0000 mg | ORAL_TABLET | Freq: Every evening | ORAL | Status: DC | PRN

## 2013-02-06 MED ORDER — FENTANYL 2.5 MCG/ML BUPIVACAINE 1/10 % EPIDURAL INFUSION (WH - ANES)
14.0000 mL/h | INTRAMUSCULAR | Status: DC | PRN
  Administered 2013-02-06: 14 mL/h via EPIDURAL
  Filled 2013-02-06: qty 125

## 2013-02-06 MED ORDER — LIDOCAINE HCL (PF) 1 % IJ SOLN
30.0000 mL | INTRAMUSCULAR | Status: DC | PRN
  Filled 2013-02-06: qty 30

## 2013-02-06 MED ORDER — DIBUCAINE 1 % RE OINT: 1.0000 "application " | TOPICAL_OINTMENT | RECTAL | Status: DC | PRN

## 2013-02-06 MED ORDER — ACETAMINOPHEN 325 MG PO TABS: 650.0000 mg | ORAL_TABLET | ORAL | Status: DC | PRN

## 2013-02-06 MED ORDER — PHENYLEPHRINE 40 MCG/ML (10ML) SYRINGE FOR IV PUSH (FOR BLOOD PRESSURE SUPPORT)
80.0000 ug | PREFILLED_SYRINGE | INTRAVENOUS | Status: DC | PRN
  Filled 2013-02-06: qty 2

## 2013-02-06 MED ORDER — IBUPROFEN 600 MG PO TABS
600.0000 mg | ORAL_TABLET | Freq: Four times a day (QID) | ORAL | Status: DC
  Administered 2013-02-06 – 2013-02-07 (×2): 600 mg via ORAL
  Filled 2013-02-06 (×5): qty 1

## 2013-02-06 MED ORDER — TETANUS-DIPHTH-ACELL PERTUSSIS 5-2.5-18.5 LF-MCG/0.5 IM SUSP: 0.5000 mL | Freq: Once | INTRAMUSCULAR | Status: DC

## 2013-02-06 MED ORDER — BENZOCAINE-MENTHOL 20-0.5 % EX AERO: 1.0000 "application " | INHALATION_SPRAY | CUTANEOUS | Status: DC | PRN

## 2013-02-06 MED ORDER — OXYCODONE-ACETAMINOPHEN 5-325 MG PO TABS: 1.0000 | ORAL_TABLET | ORAL | Status: DC | PRN

## 2013-02-06 MED ORDER — ONDANSETRON HCL 4 MG/2ML IJ SOLN: 4.0000 mg | Freq: Four times a day (QID) | INTRAMUSCULAR | Status: DC | PRN

## 2013-02-06 MED ORDER — WITCH HAZEL-GLYCERIN EX PADS: 1.0000 "application " | MEDICATED_PAD | CUTANEOUS | Status: DC | PRN

## 2013-02-06 MED ORDER — PHENYLEPHRINE 40 MCG/ML (10ML) SYRINGE FOR IV PUSH (FOR BLOOD PRESSURE SUPPORT)
80.0000 ug | PREFILLED_SYRINGE | INTRAVENOUS | Status: DC | PRN
  Filled 2013-02-06: qty 2
  Filled 2013-02-06: qty 5

## 2013-02-06 MED ORDER — OXYTOCIN 40 UNITS IN LACTATED RINGERS INFUSION - SIMPLE MED: 62.5000 mL/h | INTRAVENOUS | Status: DC

## 2013-02-06 MED ORDER — LACTATED RINGERS IV SOLN
500.0000 mL | Freq: Once | INTRAVENOUS | Status: AC
  Administered 2013-02-06: 1000 mL via INTRAVENOUS

## 2013-02-06 MED ORDER — EPHEDRINE 5 MG/ML INJ
10.0000 mg | INTRAVENOUS | Status: DC | PRN
  Filled 2013-02-06: qty 2

## 2013-02-06 MED ORDER — LACTATED RINGERS IV SOLN
INTRAVENOUS | Status: DC
  Administered 2013-02-06 (×2): via INTRAVENOUS

## 2013-02-06 MED ORDER — PRENATAL MULTIVITAMIN CH
1.0000 | ORAL_TABLET | Freq: Every day | ORAL | Status: DC
  Administered 2013-02-07: 1 via ORAL
  Filled 2013-02-06: qty 1

## 2013-02-06 MED ORDER — DIPHENHYDRAMINE HCL 25 MG PO CAPS: 25.0000 mg | ORAL_CAPSULE | Freq: Four times a day (QID) | ORAL | Status: DC | PRN

## 2013-02-06 MED ORDER — CITRIC ACID-SODIUM CITRATE 334-500 MG/5ML PO SOLN: 30.0000 mL | ORAL | Status: DC | PRN

## 2013-02-06 MED ORDER — FLEET ENEMA 7-19 GM/118ML RE ENEM: 1.0000 | ENEMA | RECTAL | Status: DC | PRN

## 2013-02-06 MED ORDER — DIPHENHYDRAMINE HCL 50 MG/ML IJ SOLN: 12.5000 mg | INTRAMUSCULAR | Status: DC | PRN

## 2013-02-06 MED ORDER — LACTATED RINGERS IV SOLN: 500.0000 mL | INTRAVENOUS | Status: DC | PRN

## 2013-02-06 MED ORDER — OXYTOCIN BOLUS FROM INFUSION: 500.0000 mL | INTRAVENOUS | Status: DC

## 2013-02-06 MED ORDER — EPHEDRINE 5 MG/ML INJ
10.0000 mg | INTRAVENOUS | Status: DC | PRN
  Filled 2013-02-06: qty 2
  Filled 2013-02-06: qty 4

## 2013-02-06 MED ORDER — OXYTOCIN 40 UNITS IN LACTATED RINGERS INFUSION - SIMPLE MED
1.0000 m[IU]/min | INTRAVENOUS | Status: DC
  Administered 2013-02-06: 2 m[IU]/min via INTRAVENOUS
  Administered 2013-02-06: 41.667 m[IU]/min via INTRAVENOUS
  Administered 2013-02-06: 4 m[IU]/min via INTRAVENOUS
  Administered 2013-02-06: 6 m[IU]/min via INTRAVENOUS
  Filled 2013-02-06: qty 1000

## 2013-02-06 MED ORDER — TERBUTALINE SULFATE 1 MG/ML IJ SOLN: 0.2500 mg | Freq: Once | INTRAMUSCULAR | Status: DC | PRN

## 2013-02-06 MED ORDER — ONDANSETRON HCL 4 MG PO TABS: 4.0000 mg | ORAL_TABLET | ORAL | Status: DC | PRN

## 2013-02-06 MED ORDER — IBUPROFEN 600 MG PO TABS: 600.0000 mg | ORAL_TABLET | Freq: Four times a day (QID) | ORAL | Status: DC | PRN

## 2013-02-06 MED ORDER — LANOLIN HYDROUS EX OINT: TOPICAL_OINTMENT | CUTANEOUS | Status: DC | PRN

## 2013-02-06 MED ORDER — LIDOCAINE HCL (PF) 1 % IJ SOLN
INTRAMUSCULAR | Status: DC | PRN
  Administered 2013-02-06 (×4): 4 mL

## 2013-02-06 MED ORDER — SENNOSIDES-DOCUSATE SODIUM 8.6-50 MG PO TABS
2.0000 | ORAL_TABLET | Freq: Every day | ORAL | Status: DC
  Administered 2013-02-06: 2 via ORAL

## 2013-02-06 MED ORDER — ONDANSETRON HCL 4 MG/2ML IJ SOLN: 4.0000 mg | INTRAMUSCULAR | Status: DC | PRN

## 2013-02-06 NOTE — Progress Notes (Signed)
Joyce Bauer is a 32 y.o. [id number] at [redacted]w[redacted]d by LMP admitted for induction of labor due to Post dates. Due date 8/1.  Subjective: comfortable  Objective: BP 113/66  Pulse 71  Temp(Src) 97.8 F (36.6 C) (Oral)  Ht 5\' 5"  (1.651 m)  Wt 90.266 kg (199 lb)  BMI 33.12 kg/m2  SpO2 93%  LMP 04/21/2012      FHT:  FHR: 145 bpm, variability: moderate,  accelerations:  Present,  decelerations:  Absent UC:   regular, every 3-4 minutes SVE:   Dilation: 8.5 Effacement (%): 80 Station: -2 Exam by:: m wilkins rnc  Labs: Lab Results  Component Value Date   WBC 10.2 02/06/2013   HGB 12.5 02/06/2013   HCT 37.5 02/06/2013   MCV 88.9 02/06/2013   PLT 196 02/06/2013    Assessment / Plan: Induction of labor due to postterm,  progressing well on pitocin TOLAC  Labor: Progressing normally Preeclampsia:  labs stable Fetal Wellbeing:  Category I Pain Control:  Epidural I/D:  n/a Anticipated MOD:  NSVD  Kolten Ryback J 02/06/2013, 2:31 PM

## 2013-02-06 NOTE — Anesthesia Procedure Notes (Signed)
Epidural Patient location during procedure: OB Start time: 02/06/2013 10:39 AM  Staffing Performed by: anesthesiologist   Preanesthetic Checklist Completed: patient identified, site marked, surgical consent, pre-op evaluation, timeout performed, IV checked, risks and benefits discussed and monitors and equipment checked  Epidural Patient position: sitting Prep: site prepped and draped and DuraPrep Patient monitoring: continuous pulse ox and blood pressure Approach: midline Injection technique: LOR air  Needle:  Needle type: Tuohy  Needle gauge: 17 G Needle length: 9 cm and 9 Needle insertion depth: 4.5 cm Catheter type: closed end flexible Catheter size: 19 Gauge Catheter at skin depth: 9.5 cm Test dose: negative  Assessment Events: blood not aspirated, injection not painful, no injection resistance, negative IV test and no paresthesia  Additional Notes Discussed risk of headache, infection, bleeding, nerve injury and failed or incomplete block.  Patient voices understanding and wishes to proceed.  Epidural placed easily on first attempt.  No paresthesia.  Patient tolerated procedure well with no apparent complications.  Jasmine December, MDReason for block:procedure for pain

## 2013-02-06 NOTE — Anesthesia Preprocedure Evaluation (Signed)
Anesthesia Evaluation  Patient identified by MRN, date of birth, ID band Patient awake    Reviewed: Allergy & Precautions, H&P , NPO status , Patient's Chart, lab work & pertinent test results, reviewed documented beta blocker date and time   History of Anesthesia Complications Negative for: history of anesthetic complications  Airway Mallampati: I TM Distance: >3 FB Neck ROM: full    Dental  (+) Teeth Intact   Pulmonary neg pulmonary ROS,  breath sounds clear to auscultation        Cardiovascular negative cardio ROS  Rhythm:regular Rate:Normal     Neuro/Psych negative neurological ROS  negative psych ROS   GI/Hepatic negative GI ROS, Neg liver ROS,   Endo/Other  negative endocrine ROS  Renal/GU negative Renal ROS     Musculoskeletal   Abdominal   Peds  Hematology negative hematology ROS (+)   Anesthesia Other Findings   Reproductive/Obstetrics (+) Pregnancy (c/s x1 for twins, attempting VBAC)                           Anesthesia Physical Anesthesia Plan  ASA: II  Anesthesia Plan: Epidural   Post-op Pain Management:    Induction:   Airway Management Planned:   Additional Equipment:   Intra-op Plan:   Post-operative Plan:   Informed Consent: I have reviewed the patients History and Physical, chart, labs and discussed the procedure including the risks, benefits and alternatives for the proposed anesthesia with the patient or authorized representative who has indicated his/her understanding and acceptance.     Plan Discussed with:   Anesthesia Plan Comments:         Anesthesia Quick Evaluation

## 2013-02-06 NOTE — H&P (Signed)
NAMEBRIGGITTE, Joyce Bauer              ACCOUNT NO.:  [id number]  MEDICAL RECORD NO.:  [id number]  LOCATION:  9167                          FACILITY:  [facility]  PHYSICIAN:  Lenoard Aden, M.D.DATE OF BIRTH:  07/29/1980  DATE OF ADMISSION:  02/06/2013 DATE OF DISCHARGE:                             HISTORY & PHYSICAL   CHIEF COMPLAINT:  Postdates induction.  She is a 32 year old white female, G5, P1-1-2-3 with a history of C-section, history vaginal delivery, who presents now for trial of labor after cesarean section at 42 weeks for induction.  ALLERGIES:  She has no known drug allergies.  MEDICATIONS:  Prenatal vitamins.  She is a nonsmoker, nondrinker.  She denies domestic or physical violence.  She has a history of successful vaginal delivery in 2010 for a 7-pound 12-ounce fetus, history of C-section with a twin gestation in 2012.  Prenatal course uncomplicated.  FAMILY HISTORY:  Diabetes, heart disease, hypertension.  PHYSICAL EXAMINATION:  GENERAL:  She is a well-developed, well-nourished white female, in no acute distress. HEENT:  Normal. NECK:  Supple.  Full range of motion. LUNGS:  Clear. HEART:  Regular rate and rhythm. ABDOMEN:  Soft, gravid, nontender.  Estimated fetal weight 7.5 pounds. Cervix is 2-3 cm, 70%, vertex, -1. EXTREMITIES:  There are no cords. NEUROLOGIC:  Nonfocal. SKIN:  Intact.  IMPRESSION:  A 42-week intrauterine pregnancy for trial of labor after previous cesarean section and history of successful vaginal delivery.  PLAN:  Judicious Pitocin induction, epidural as needed.  Anticipate attempts at vaginal delivery __________.     Lenoard Aden, M.D.    RJT/MEDQ  D:  02/06/2013  T:  02/06/2013  Job:  [id number]

## 2013-02-06 NOTE — Progress Notes (Signed)
Joyce Bauer is a 32 y.o. [id number] at [redacted]w[redacted]d by LMP admitted for induction of labor due to Post dates. Due date 8/1.  Subjective: comfortable  Objective: BP 123/62  Pulse 73  Temp(Src) 97.3 F (36.3 C)  Ht 5\' 5"  (1.651 m)  Wt 90.266 kg (199 lb)  BMI 33.12 kg/m2  LMP 04/21/2012      FHT:  FHR: 155 bpm, variability: moderate,  accelerations:  Present,  decelerations:  Absent UC:   irregular, every 10 minutes SVE:   2-3/50/-2 Attempted AROM  Labs: Lab Results  Component Value Date   WBC 8.4 06/24/2011   HGB 9.7* 06/24/2011   HCT 29.6* 06/24/2011   MCV 90.5 06/24/2011   PLT 149* 06/24/2011    Assessment / Plan: Induction of labor due to postterm,  progressing well on pitocin  Labor: Progressing normally and Progressing on Pitocin, will continue to increase then AROM Preeclampsia:  labs stable Fetal Wellbeing:  Category I Pain Control:  Labor support without medications I/D:  n/a Anticipated MOD:  NSVD  Tai Syfert J 02/06/2013, 8:11 AM

## 2013-02-07 LAB — CBC
MCH: 29.6 pg (ref 26.0–34.0)
MCHC: 33.6 g/dL (ref 30.0–36.0)
MCV: 88 fL (ref 78.0–100.0)
Platelets: 158 10*3/uL (ref 150–400)
RBC: 3.99 MIL/uL (ref 3.87–5.11)
RDW: 13.3 % (ref 11.5–15.5)

## 2013-02-07 NOTE — Anesthesia Postprocedure Evaluation (Signed)
  Anesthesia Post-op Note  Patient: Joyce Bauer  Procedure(s) Performed: * No procedures listed *  Patient Location: PACU and Mother/Baby  Anesthesia Type:Epidural  Level of Consciousness: awake, alert  and oriented  Airway and Oxygen Therapy: Patient Spontanous Breathing  Post-op Pain: mild  Post-op Assessment: Patient's Cardiovascular Status Stable, Respiratory Function Stable, Patent Airway, No signs of Nausea or vomiting and Pain level controlled  Post-op Vital Signs: stable  Complications: No apparent anesthesia complications

## 2013-02-07 NOTE — Lactation Note (Signed)
This note was copied from the chart of Joyce Bauer. Lactation Consultation Note  Follow up consult with this mom of a term baby, now just over 24 hours old. Mom was breast feeding when I came in, sitting forward in bed. i had mom sit back to support her back, and mom said she felt pinching. She unlatched baby, and he eagerly  relatched deeply, and mom felt no pinching  . I showed mom how to hold baby and support his back, not head. Mom was able to independently relatch a little later. Cluster feeding    Reviewed. Mom knows to call for questions/concerns.           Patient Name: Joyce Bauer ONGEX'B Date: 02/07/2013 Reason for consult: Follow-up assessment   Maternal Data    Feeding Feeding Type: Breast Milk Length of feed: 25 min  LATCH Score/Interventions Latch: Grasps breast easily, tongue down, lips flanged, rhythmical sucking.  Audible Swallowing: None  Type of Nipple: Everted at rest and after stimulation  Comfort (Breast/Nipple): Soft / non-tender     Hold (Positioning): Assistance needed to correctly position infant at breast and maintain latch. Intervention(s): Breastfeeding basics reviewed;Support Pillows;Position options;Skin to skin  LATCH Score: 7  Lactation Tools Discussed/Used     Consult Status Consult Status: Follow-up Date: 02/08/13 Follow-up type: In-patient    Alfred Levins 02/07/2013, 4:41 PM

## 2013-02-07 NOTE — Progress Notes (Signed)
PPD #1- SVD  Subjective:   Reports feeling good Tolerating po/ No nausea or vomiting Bleeding is light Pain controlled with no pain, not taking anything Up ad lib / ambulatory / voiding without problems Newborn: breastfeeding  / Circumcision: today   Objective:   ZO:XWRU:  [97.4 F (36.3 C)-98.2 F (36.8 C)] 98.2 F (36.8 C) (08/13 0550) Pulse Rate:  [68-94] 68 (08/13 0550) Resp:  [18] 18 (08/13 0550) BP: (92-128)/(51-81) 108/60 mmHg (08/13 0550) SpO2:  [93 %-100 %] 100 % (08/12 1750)  LABS:  Recent Labs  02/06/13 0755 02/07/13 0625  WBC 10.2 11.1*  HGB 12.5 11.8*  PLT 196 158   Blood type: O/Positive/-- (12/27 0000) Rubella: Immune (12/27 0000)   I&O: Intake/Output     08/12 0701 - 08/13 0700 08/13 0701 - 08/14 0700   Blood 200    Total Output 200     Net -200            Physical Exam: Alert and oriented x3 Abdomen: soft, non-tender, non-distended  Fundus: firm, non-tender, U-2 Perineum: intact Lochia: small Extremities: no edema, no calf pain or tenderness    Assessment:  PPD # [id number]/ S/P: VBAC  Doing well    Plan: Continue routine post partum orders Anticipate D/C home in AM   Sanchez Hemmer, N MSN, CNM 02/07/2013, 11:18 AM

## 2013-02-08 MED ORDER — IBUPROFEN 200 MG PO TABS: 400.0000 mg | ORAL_TABLET | Freq: Four times a day (QID) | ORAL | Status: DC

## 2013-02-08 NOTE — Progress Notes (Signed)
UR chart review completed.  

## 2013-02-08 NOTE — Progress Notes (Signed)
PPD 2 SVD  S:  Reports feeling well - ready to go home             Tolerating po/ No nausea or vomiting             Bleeding is light, moderate             Pain controlled with OTC Motrin planned for home - just using motrin here             Up ad lib / ambulatory / voiding QS  Newborn breast feeding  / Circumcision done  O:               VS: BP 113/63  Pulse 74  Temp(Src) 98.4 F (36.9 C) (Oral)  Resp 16  Ht 5\' 5"  (1.651 m)  Wt 90.266 kg (199 lb)  BMI 33.12 kg/m2  SpO2 100%  LMP 04/21/2012   LABS:              Recent Labs  02/06/13 0755 02/07/13 0625  WBC 10.2 11.1*  HGB 12.5 11.8*  PLT 196 158               Blood type: O/Positive/-- (12/27 0000)  Rubella: Immune (12/27 0000)                                Physical Exam:             Alert and oriented X3  Abdomen: soft, non-tender, non-distended              Fundus: firm, non-tender, U-1  Perineum: mild edema  Lochia: light  Extremities: trace edema, no calf pain or tenderness    A: PPD # 2   Doing well - stable status  P: Routine post partum orders  DC home - [facility]OB booklet - instructions reviewed  Marlinda Mike CNM, MSN, FACNM 02/08/2013, 9:22 AM

## 2013-02-08 NOTE — Discharge Summary (Signed)
Obstetric Discharge Summary  Reason for Admission: induction of labor Prenatal Procedures: NST and ultrasound Intrapartum Procedures: spontaneous vaginal delivery and repeat TOLAC - successful VBAC Postpartum Procedures: none Complications-Operative and Postpartum: none Hemoglobin  Date Value Range Status  02/07/2013 11.8* 12.0 - 15.0 g/dL Final     HCT  Date Value Range Status  02/07/2013 35.1* 36.0 - 46.0 % Final    Physical Exam:  General: alert, cooperative and no distress Lochia: appropriate Uterine Fundus: firm Incision: intact  DVT Evaluation: No evidence of DVT seen on physical exam.  Discharge Diagnoses: Term Pregnancy-delivered and repeat VBAC  Discharge Information: Date: 02/08/2013 Activity: pelvic rest Diet: routine Medications: PNV and Ibuprofen Condition: stable Instructions: refer to practice specific booklet Discharge to: home Follow-up Information   Follow up with Joyce Aden, MD. Schedule an appointment as soon as possible for a visit in 6 weeks.   Specialty:  Obstetrics and Gynecology   Contact information:   [address] [place] [address] [phone]       Newborn Data: Live born female  Birth Weight: 7 lb 13.4 oz (3555 g) APGAR: 8, 9  Home with mother.  Joyce Bauer 02/08/2013, 9:31 AM

## 2014-04-29 ENCOUNTER — Encounter (HOSPITAL_COMMUNITY): Payer: Self-pay

## 2014-09-13 ENCOUNTER — Other Ambulatory Visit: Payer: Self-pay | Admitting: Registered Nurse

## 2014-09-13 ENCOUNTER — Other Ambulatory Visit (HOSPITAL_COMMUNITY)
Admission: RE | Admit: 2014-09-13 | Discharge: 2014-09-13 | Disposition: A | Discharge status: Home / Self Care | Payer: Managed Care, Other (non HMO) | Source: Ambulatory Visit | Attending: Internal Medicine | Admitting: Internal Medicine

## 2014-09-13 DIAGNOSIS — Z01419 Encounter for gynecological examination (general) (routine) without abnormal findings: Secondary | ICD-10-CM | POA: Insufficient documentation

## 2014-09-17 LAB — CYTOLOGY - PAP

## 2017-10-18 ENCOUNTER — Other Ambulatory Visit: Payer: Self-pay | Admitting: Internal Medicine

## 2017-10-18 DIAGNOSIS — N644 Mastodynia: Secondary | ICD-10-CM

## 2017-10-21 ENCOUNTER — Ambulatory Visit
Admission: RE | Admit: 2017-10-21 | Discharge: 2017-10-21 | Disposition: A | Discharge status: Home / Self Care | Payer: Managed Care, Other (non HMO) | Source: Ambulatory Visit | Attending: Internal Medicine | Admitting: Internal Medicine

## 2017-10-21 ENCOUNTER — Ambulatory Visit
Admission: RE | Admit: 2017-10-21 | Discharge: 2017-10-21 | Disposition: A | Discharge status: Home / Self Care | Payer: BLUE CROSS/BLUE SHIELD | Source: Ambulatory Visit | Attending: Internal Medicine | Admitting: Internal Medicine

## 2017-10-21 DIAGNOSIS — N644 Mastodynia: Secondary | ICD-10-CM

## 2018-09-11 ENCOUNTER — Other Ambulatory Visit: Payer: Self-pay | Admitting: Unknown Physician Specialty

## 2018-09-11 ENCOUNTER — Other Ambulatory Visit: Payer: Self-pay | Admitting: Internal Medicine

## 2018-09-11 DIAGNOSIS — Z1231 Encounter for screening mammogram for malignant neoplasm of breast: Secondary | ICD-10-CM

## 2018-10-25 ENCOUNTER — Ambulatory Visit: Payer: BLUE CROSS/BLUE SHIELD

## 2018-12-01 ENCOUNTER — Ambulatory Visit: Payer: BLUE CROSS/BLUE SHIELD

## 2019-01-15 ENCOUNTER — Ambulatory Visit
Admission: RE | Admit: 2019-01-15 | Discharge: 2019-01-15 | Disposition: A | Discharge status: Home / Self Care | Payer: PRIVATE HEALTH INSURANCE | Source: Ambulatory Visit | Attending: Internal Medicine | Admitting: Internal Medicine

## 2019-01-15 ENCOUNTER — Other Ambulatory Visit: Payer: Self-pay

## 2019-01-15 DIAGNOSIS — Z1231 Encounter for screening mammogram for malignant neoplasm of breast: Secondary | ICD-10-CM

## 2019-04-03 LAB — OB RESULTS CONSOLE HIV ANTIBODY (ROUTINE TESTING): HIV: NONREACTIVE

## 2019-04-03 LAB — OB RESULTS CONSOLE GC/CHLAMYDIA
Chlamydia: NEGATIVE
Gonorrhea: NEGATIVE

## 2019-04-03 LAB — OB RESULTS CONSOLE ABO/RH: RH Type: POSITIVE

## 2019-04-03 LAB — OB RESULTS CONSOLE RUBELLA ANTIBODY, IGM: Rubella: IMMUNE

## 2019-04-03 LAB — OB RESULTS CONSOLE HEPATITIS B SURFACE ANTIGEN: Hepatitis B Surface Ag: NEGATIVE

## 2019-04-03 LAB — OB RESULTS CONSOLE ANTIBODY SCREEN: Antibody Screen: NEGATIVE

## 2019-04-03 LAB — OB RESULTS CONSOLE RPR: RPR: NONREACTIVE

## 2019-04-17 ENCOUNTER — Other Ambulatory Visit: Payer: Self-pay

## 2019-04-17 ENCOUNTER — Ambulatory Visit (HOSPITAL_COMMUNITY): Payer: PRIVATE HEALTH INSURANCE | Attending: Obstetrics and Gynecology | Admitting: Genetic Counselor

## 2019-04-17 ENCOUNTER — Ambulatory Visit (HOSPITAL_COMMUNITY): Payer: PRIVATE HEALTH INSURANCE

## 2019-04-17 ENCOUNTER — Ambulatory Visit (HOSPITAL_COMMUNITY): Payer: Self-pay | Admitting: Genetic Counselor

## 2019-04-17 DIAGNOSIS — Z8279 Family history of other congenital malformations, deformations and chromosomal abnormalities: Secondary | ICD-10-CM

## 2019-04-17 DIAGNOSIS — Z36 Encounter for antenatal screening for chromosomal anomalies: Secondary | ICD-10-CM

## 2019-04-17 DIAGNOSIS — O09521 Supervision of elderly multigravida, first trimester: Secondary | ICD-10-CM

## 2019-04-17 DIAGNOSIS — Z8489 Family history of other specified conditions: Secondary | ICD-10-CM

## 2019-04-17 DIAGNOSIS — Z315 Encounter for genetic counseling: Secondary | ICD-10-CM | POA: Diagnosis not present

## 2019-04-17 DIAGNOSIS — Z3A11 11 weeks gestation of pregnancy: Secondary | ICD-10-CM

## 2019-04-17 NOTE — Progress Notes (Signed)
04/17/2019  LARINE PRUSKI 11/01/1980 MRN: [id number] DOV: 04/17/2019  Ms. Kovash presented to the [facility] for a genetics consultation regarding advanced maternal age and her history of a previous child with neurofibromatosis type I. Ms. Eiken came to her appointment alone due to COVID-19 visitor restrictions.   Indication for genetic counseling - Advanced maternal age - Previous child with neurofibromatosis type I (NF1)  Prenatal history  Ms. Broschart is a G6P179, 38 y.o. year old female. Her current pregnancy has completed [redacted]w[redacted]d (Estimated Date of Delivery: 11/04/19).  Ms. Beiter denied exposure to environmental toxins or chemical agents. She denied the use of alcohol, tobacco or street drugs. She reported taking prenatal vitamins. She denied significant viral illnesses, fevers, and bleeding during the course of her pregnancy. Her medical and surgical histories were noncontributory.  Family History  A three generation pedigree was drafted and reviewed. The family history is remarkable for the following:  - Ms. Ducommun has a son named Boyd Kerbs who has neurofibromatosis type I (NF1). Boyd Kerbs has many of the cutaneous and subcutaneous features of the condition, including neurofibromas, cafe au lait macules, and axillary and inguinal freckling. Ms. Trumm reports that Boyd Kerbs is doing very well. No one else in the family has NF1. See Discussion section for more details.  - Ms. Reinitz's husband, Charneice Weedon, had a heart attack at 40 years of age. We discussed that many forms of heart disease are multifactorial in nature, occurring due to a combination of genetic, lifestyle, and environmental factors. Known risk factors for having a heart attack at a young age include substance abuse, smoking, hypertension, hypercholesterolemia, lack of physical activity, diabetes, and poor diet. However, certain genetic conditions can be associated with heart attacks at  young ages. Given that no one else in the family has a history of heart disease or heart attacks, Ms. Wortmann's husband's heart attack was likely multifactorial in nature. However, since the exact cause of his early heart attack is unknown, precise recurrence risk for the couple's children cannot be determined.   - Mr. Lyndon has a paternal uncle with prelingual deafness who also has a son with prelingual deafness. We discussed that there are several possible explanations for his family members' hearing loss, including genetic conditions or errors in embryonal/fetal development. Given that these relatives have no other health concerns, it is difficult to determine if their deafness is related to a underlying genetic condition or has another etiology. However, literature indicates that up to 80% of congenital hearing loss is caused by genetic factors. Of that 80%, 20% is syndromic and 80% is nonsyndromic. We briefly reviewed that there are various possible patterns of inheritance associated with hearing loss. Without knowing the cause of these relatives' hearing loss, precise risk assessment is limited.  The remaining family histories were reviewed and found to be noncontributory for birth defects, intellectual disability, recurrent pregnancy loss, and known genetic conditions.    The patient's ethnicity is [place] and [place]. The father of the pregnancy's ethnicity is [place] and [place]. Ashkenazi Jewish ancestry and consanguinity were denied. Pedigree will be scanned under Media.  Discussion  Ms. Emmerling was referred for genetic counseling to discuss her history of having a previous child affected by neurofibromatosis type I (NF1). NF1 is a condition characterized by changes in skin pigmentation and the growth of tumors along nerves in the skin, brain, and other parts of the body. Many individuals with NF1 develop cafe-au-lait macules, or flat patches of darkly pigmented  skin in childhood. These  increase in size and number as an individual grows older. Inguinal and axillary freckling are also common skin findings in individuals with NF1. Most adults with NF1 develop neurofibromas, or benign tumors located on or just underneath the skin, near the spinal cord, or along nerves. Approximately 50% of individuals with NF1 develop plexiform neurofibromas, which are larger, more extensive tumors that grow along nerves anywhere in the body. Neurofibromas may also undergo transformation into malignant peripheral nerve sheath tumors in 8-13% of affected individuals. Other benign growths in individuals with NF1 may include Lisch nodules in the iris and optic gliomas along the optic nerve. Additional signs and symptoms of NF1 vary, but can include hypertension, short stature, macrocephaly, scoliosis, stroke, and cardiac issues. Although most individuals with NF1 have normal intelligence, approximately 50% have a specific learning disability. ADHD is also common in affected individuals. NF1 is variably expressed, meaning that symptoms and severity differ from person to person. Diagnosis may be made based on clinical features or by genetic testing. Boyd Kerbs was diagnosed clinically and has never had genetic testing performed.    NF1 is caused by heterozygous pathogenic variants in the NF1 gene. The NF1 gene provides instructions for making the protein neurofibromin. Neurofibromin is produced in many cells, including nerve cells. Neurofibromin acts as a tumor suppressor that regulates cell growth and division. Individuals with NF1 are born with one pathogenic variant in the NF1 gene in every cell of the body. Over time, a second mutation, or "second hit" in the NF1 gene occurs in certain cells, rendering those cells incapable of producing functional neurofibromin. The loss of this protein allows cells to grow and divide in an uncontrolled fashion, leading to the formation of tumors along nerves throughout the body.   NF1  is inherited in an autosomal dominant fashion. This indicates that having one copy of an altered NF1 gene in each cell is sufficient to increase the risk of developing tumors and other features of NF1. Approximately 50% of individuals with NF1 are born with de novo (new) mutations in the NF1 gene. These cases are sporadic and occur in individuals with no family history of NF1. The remaining 50% of cases inherit an altered NF1 gene from a parent who has the disorder. Ms. Metro and her husband have never been evaluated for NF1. 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 Wilkens reports that they do not have any of the symptoms of the condition themselves. 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 We discussed that rare cases of germline mosaicism have been described; thus, Ms. Deutch and her husband likely have approximately a 1% recurrence risk for NF1 in another child. However, given that an inherited cause has not been ruled out, recurrence risk could be as high as 50%. If Boyd Kerbs has children of his own someday, his children will all have a 50% chance of also having NF1.  We reviewed that there are several options to determine the NF1 status of a pregnancy, either prior to conception or during pregnancy. Firstly, preimplantation genetic testing for monogenic/single gene conditions (PGT-M) including NF1 is possible for future pregnancies if a familial pathogenic variant in the NF1 gene has been identified through DNA testing. PGT-M utilizes in vitro fertilization, with genetic testing for NF1 performed prior to embryo implantation. Embryos that are unaffected are then transferred to the uterus for implantation. We discussed that  this technology is relatively new, and thus the process can be costly. Insurance oftentimes does not cover the cost of the procedure.   Ms. Zieser was also counseled about the  option of diagnostic testing for fetal NF1 status. We reviewed that diagnostic testing via chorionic villus sampling (CVS) or amniocentesis is available in her current pregnancy to determine whether the fetus has NF1. We discussed the technical aspects of each procedure and quoted up to a 1 in 500 (0.2%) risk for spontaneous pregnancy loss or other adverse pregnancy outcomes as a result of the procedure. Cells from a placental or amniotic fluid sample allow for direct genetic testing of the NF1 gene. However, we reviewed that if a fetus was confirmed to have a pathogenic variant in NF1, it would not be possible to predict the symptoms the baby would have after birth. For example, we would not be able to determine whether or not a baby would having learning difficulties during pregnancy. Cultured cells from a CVS or amniocentesis sample also allow for the visualization of a fetal karyotype, which can detect >99% of chromosomal aberrations. Chromosomal microarray can also be performed to identify smaller deletions or duplications of fetal chromosomal material. After careful consideration, Ms. Rajput declined diagnostic testing at this time. She understands that amniocentesis is available at any point during pregnancy and that she may opt to undergo the procedure at a later date should she change her mind.    We also discussed ultrasound's role in determining whether or not a fetus has NF1 prenatally. There have been a few reports of severe cases of NF1 in which fetuses showed some signs of the condition on ultrasound. However, ultrasound is unlikely to be informative regarding fetal NF1 status in most cases. We reviewed that even if there were no signs on ultrasound and no prenatal diagnostic testing is performed, children who are affected by NF1 that have an affected first degree relative can usually be identified within the first year of life based on clinical diagnosis alone. Thus, a consultation with pediatric  genetics may be warranted postnatally.  Ms.Phillipswas also counseled on considerations relevant to advanced maternal age, as she will be25 years old at the time of delivery.At her age and during thefirst trimester, there is approximately a 1 in22(4.5%) chance of having a child with a chromosomal abnormality. Her age-related risk to have a child with Down syndrome specifically is 1 in43(2.3%)in the first trimester. We briefly discussed features of trisomy 89 (Down syndrome), trisomy 61, and trisomy 31. These conditions often are not inherited, but instead occurdueto an error in chromosomal division during the formation of sperm and egg cells in a process called nondisjunction. Nondisjunction occurs more frequently as a woman ages.   We reviewed noninvasive prenatal screening (NIPS) as an available screening option for chromosomal aneuploidies. Specifically, we discussed that NIPS analyzes cell free DNA originating from the placenta that is found in the maternal blood circulation during pregnancy. This test is not diagnostic for chromosome conditions, but can provide information regarding the presence or absence of extra fetal DNA for chromosomes 13, 18 and 21. Thus, it would not identify or rule out all fetal aneuploidy. The reported detection rate is greater than 99% for trisomy 23, trisomy 27, and trisomy 58. The false positive rate is reported to be less than 1% for any of these conditions. Ms. Soellner indicated that she is interested in undergoing NIPS.  Per ACOG recommendation, carrier screening for hemoglobinopathies, cystic fibrosis (CF) and spinal muscular atrophy (  SMA) was discussed including information about the conditions, rationale for testing, autosomal recessive inheritance, and the option of prenatal diagnosis. Ms. Chavez declined carrier screening for CF, SMA, and hemoglobinopathies at this time. Without carrier screening to refine risk and based on ethnicity alone, Ms.  Pierro's risk to be a carrier of CF is 1 in 28. Her risk to be a carrier of SMA is 1 in 42. Her risk to be a carrier of HBB-related hemoglobinopathies is 1 in 108. She was informed that select hemoglobinopathies and CF are included on [place]'s newborn screen, but that SMA currently is not included. We discussed the Early Check research study to add SMA to her baby's newborn screening panel. Ms. Weckman indicated that she was interested in pursuing this, so she was given written information on how to enroll in the Early Check study.   Lastly, screening for open neural tube defects (ONTDs) via MS-AFP in the second trimester in addition to level II ultrasound examination is recommended. Level II ultrasound and MS-AFP are able to detect ONTDs with 90-95% sensitivity. However, normal results from any of the above options do not guarantee a normal baby, as 3-5% of newborns have some type of birth defect, many of which are not prenatally diagnosable.  Ms. Wogoman had her blood drawn for MaterniT21 NIPS today. Results take approximately 5-7 days to be returned. I will call Ms. Senesac with her results when they become available. Ms. Chanley indicated that she may be interested in pursuing diagnostic testing should NIPS come back as high-risk for a chromosomal aneuploidy. We will discuss this in more detail if necessary at the time of results disclosure.  I counseled Ms. Murner regarding the above risks and available options. The approximate face-to-face time with the genetic counselor was 40 minutes.  In summary:  Reviewed family history concerns  Son with neurofibromatosis type I (NF1)  Recurrence risk likely around 1%, but could be as high as 50%  Discussed risk for chromosomal conditionsand options for follow-up testing  Opted to undergo NIPS. We will follow results  Discussed carrier screening for cystic fibrosis, spinal muscular atrophy, and hemoglobinopathies  Declined carrier  screening  Given information on how to enroll in the Early Check study to have SMA added onto baby's newborn screen  Offered additional testing and screening  Declined CVS for NF1 and chromosomal aneuploidies  Recommend MS-AFP screening at 42-18 weeks   Buelah Manis, MS Genetic Counselor

## 2019-04-21 LAB — MATERNIT 21 PLUS CORE, BLOOD
Fetal Fraction: 12
Result (T21): NEGATIVE
Trisomy 13 (Patau syndrome): NEGATIVE
Trisomy 18 (Edwards syndrome): NEGATIVE
Trisomy 21 (Down syndrome): NEGATIVE

## 2019-04-23 ENCOUNTER — Telehealth (HOSPITAL_COMMUNITY): Payer: Self-pay | Admitting: Genetic Counselor

## 2019-04-23 NOTE — Telephone Encounter (Signed)
Received a call from Ms. Garretson inquiring about her noninvasive prenatal screening (NIPS) results, which had just become available. Ms. Tuller had MaterniT21 testing through [place] was offered because of advanced maternal age. Ms. Tienda results were negative, demonstrating an expected representation of chromosome 21, 18, and 13 material. This greatly reduces the likelihood of trisomies 24, 80, or 18 for the pregnancy. Ms. Fross requested to know about the expected fetal sex, which was female. She was ecstatic and very relieved to hear about her negative result.  NIPS analyzes placental (fetal) DNA in maternal circulation. NIPS is considered to be highly specific and sensitive, but is not considered to be a diagnostic test. We reviewed that this testing identifies the majority of pregnancies with trisomies 34, 67, and 52, as well as the sex of the baby. Ms. Capehart was reminded that diagnostic testing via CVS or amniocentesis is available should she be interested in confirming this result. She confirmed that she had no questions about these results at this time.  Buelah Manis, MS Genetic Counselor

## 2019-05-22 ENCOUNTER — Encounter (HOSPITAL_COMMUNITY): Payer: Self-pay | Admitting: Obstetrics and Gynecology

## 2019-06-26 ENCOUNTER — Telehealth (HOSPITAL_COMMUNITY): Payer: Self-pay | Admitting: Genetic Counselor

## 2019-06-26 NOTE — Telephone Encounter (Signed)
Received a voicemail from Ms. Bankston last week asking if I could change the billing code for her noninvasive prenatal screening (NIPS) to the code for preventative prenatal care so that her insurance company would cover the cost of the test. I returned Ms. Crofford's call today and indicated that I am waiting on the diagnosis code change form from our [facility] rep and will send that back to the lab as soon as I fill it out so that they can make the billing change. I encouraged Ms. Luckenbill to reach back out to me if she continues to have any billing/insurance issues in the coming weeks.  Buelah Manis, MS Genetic Counselor

## 2019-06-29 NOTE — L&D Delivery Note (Signed)
Delivery Note-vbac At 3:45 PM a viable and healthy female was delivered via VBAC, Spontaneous (Presentation: Left Occiput Anterior).  APGAR: 9, 9; weight  .   Placenta status: Spontaneous, Intact.  Cord: 3 vessels with the following complications:  .  Cord pH: na  Anesthesia: Epidural Episiotomy: None Lacerations: None Suture Repair: na Est. Blood Loss (mL): 101  Mom to postpartum.  Baby to Couplet care / Skin to Skin.  Joyce Bauer J 10/30/2019, 4:27 PM

## 2019-07-28 ENCOUNTER — Inpatient Hospital Stay (HOSPITAL_COMMUNITY): Payer: No Typology Code available for payment source

## 2019-07-28 ENCOUNTER — Other Ambulatory Visit: Payer: Self-pay

## 2019-07-28 ENCOUNTER — Encounter (HOSPITAL_COMMUNITY): Payer: Self-pay | Admitting: *Deleted

## 2019-07-28 ENCOUNTER — Observation Stay (HOSPITAL_COMMUNITY)
Admission: AD | Admit: 2019-07-28 | Discharge: 2019-07-29 | Disposition: A | Discharge status: Home / Self Care | Payer: No Typology Code available for payment source | Attending: Obstetrics and Gynecology | Admitting: Obstetrics and Gynecology

## 2019-07-28 DIAGNOSIS — Z7982 Long term (current) use of aspirin: Secondary | ICD-10-CM | POA: Insufficient documentation

## 2019-07-28 DIAGNOSIS — E876 Hypokalemia: Secondary | ICD-10-CM | POA: Diagnosis not present

## 2019-07-28 DIAGNOSIS — Z20822 Contact with and (suspected) exposure to covid-19: Secondary | ICD-10-CM | POA: Insufficient documentation

## 2019-07-28 DIAGNOSIS — O99412 Diseases of the circulatory system complicating pregnancy, second trimester: Secondary | ICD-10-CM | POA: Diagnosis not present

## 2019-07-28 DIAGNOSIS — Z3A25 25 weeks gestation of pregnancy: Secondary | ICD-10-CM | POA: Diagnosis not present

## 2019-07-28 DIAGNOSIS — O26892 Other specified pregnancy related conditions, second trimester: Secondary | ICD-10-CM | POA: Insufficient documentation

## 2019-07-28 DIAGNOSIS — R001 Bradycardia, unspecified: Secondary | ICD-10-CM | POA: Diagnosis not present

## 2019-07-28 DIAGNOSIS — O09522 Supervision of elderly multigravida, second trimester: Secondary | ICD-10-CM | POA: Diagnosis not present

## 2019-07-28 DIAGNOSIS — R531 Weakness: Secondary | ICD-10-CM | POA: Diagnosis not present

## 2019-07-28 DIAGNOSIS — R0789 Other chest pain: Secondary | ICD-10-CM | POA: Diagnosis not present

## 2019-07-28 DIAGNOSIS — R0602 Shortness of breath: Secondary | ICD-10-CM | POA: Diagnosis not present

## 2019-07-28 DIAGNOSIS — R002 Palpitations: Secondary | ICD-10-CM | POA: Diagnosis not present

## 2019-07-28 DIAGNOSIS — O99282 Endocrine, nutritional and metabolic diseases complicating pregnancy, second trimester: Secondary | ICD-10-CM | POA: Insufficient documentation

## 2019-07-28 DIAGNOSIS — I493 Ventricular premature depolarization: Secondary | ICD-10-CM | POA: Insufficient documentation

## 2019-07-28 DIAGNOSIS — R55 Syncope and collapse: Secondary | ICD-10-CM | POA: Diagnosis not present

## 2019-07-28 HISTORY — DX: Anxiety disorder, unspecified: F41.9

## 2019-07-28 HISTORY — DX: Ventricular premature depolarization: I49.3

## 2019-07-28 HISTORY — DX: Attention-deficit hyperactivity disorder, unspecified type: F90.9

## 2019-07-28 LAB — URINALYSIS, ROUTINE W REFLEX MICROSCOPIC
Bilirubin Urine: NEGATIVE
Glucose, UA: NEGATIVE mg/dL
Ketones, ur: NEGATIVE mg/dL
Leukocytes,Ua: NEGATIVE
Nitrite: NEGATIVE
Protein, ur: NEGATIVE mg/dL
Specific Gravity, Urine: 1.003 — ABNORMAL LOW (ref 1.005–1.030)
pH: 7 (ref 5.0–8.0)

## 2019-07-28 LAB — COMPREHENSIVE METABOLIC PANEL
ALT: 17 U/L (ref 0–44)
AST: 19 U/L (ref 15–41)
Albumin: 2.8 g/dL — ABNORMAL LOW (ref 3.5–5.0)
Alkaline Phosphatase: 56 U/L (ref 38–126)
Anion gap: 10 (ref 5–15)
BUN: 6 mg/dL (ref 6–20)
CO2: 21 mmol/L — ABNORMAL LOW (ref 22–32)
Calcium: 8.8 mg/dL — ABNORMAL LOW (ref 8.9–10.3)
Chloride: 106 mmol/L (ref 98–111)
Creatinine, Ser: 0.4 mg/dL — ABNORMAL LOW (ref 0.44–1.00)
GFR calc Af Amer: >60 mL/min (ref >60)
GFR calc non Af Amer: >60 mL/min (ref >60)
Glucose, Bld: 106 mg/dL — ABNORMAL HIGH (ref 70–99)
Potassium: 3 mmol/L — ABNORMAL LOW (ref 3.5–5.1)
Sodium: 137 mmol/L (ref 135–145)
Total Bilirubin: 0.3 mg/dL (ref 0.3–1.2)
Total Protein: 6.1 g/dL — ABNORMAL LOW (ref 6.5–8.1)

## 2019-07-28 LAB — CBC
HCT: 34.8 % — ABNORMAL LOW (ref 36.0–46.0)
Hemoglobin: 11.7 g/dL — ABNORMAL LOW (ref 12.0–15.0)
MCH: 31.4 pg (ref 26.0–34.0)
MCHC: 33.6 g/dL (ref 30.0–36.0)
MCV: 93.3 fL (ref 80.0–100.0)
Platelets: 207 10*3/uL (ref 150–400)
RBC: 3.73 MIL/uL — ABNORMAL LOW (ref 3.87–5.11)
RDW: 12.2 % (ref 11.5–15.5)
WBC: 10.3 10*3/uL (ref 4.0–10.5)
nRBC: 0 % (ref 0.0–0.2)

## 2019-07-28 LAB — TSH: TSH: 1.368 u[IU]/mL (ref 0.350–4.500)

## 2019-07-28 MED ORDER — LACTATED RINGERS IV BOLUS
1000.0000 mL | Freq: Once | INTRAVENOUS | Status: AC
  Administered 2019-07-28: 23:00:00 1000 mL via INTRAVENOUS

## 2019-07-28 NOTE — MAU Provider Note (Signed)
History     CSN: [id number]  Arrival date and time: 07/28/19 2047   First Provider Initiated Contact with Patient 07/28/19 2153      Chief Complaint  Patient presents with  . Shortness of Breath   Joyce Bauer is a 39 y.o. [id number] at [redacted]w[redacted]d who receives care at [facility]  She presents today for Shortness of Breath.  Patient states that her symptoms started today, but it has happened in previous pregnancies.  She states she has not been concerned in previous pregnancies and that they have been occurring for weeks with this pregnancy.  She states that today around 2pm the symptoms seemed to increase in frequency and duration (10-102min).  She states they are not triggered by anything...she is not active.  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  She states she had 2 cups of coffee today, but she usually has a cup daily.  She reports she gets 10k steps daily and played soccer today, but was only the goalie.  She reports that she takes prenatal vitamins and baby aspirin daily as well as unisom as needed.  She states she has had her thyroid checked in previous pregnancy, but is unsure of whether it was checked during current one.  However, patient states she has not had a formal cardiology consult. Patient denies pregnancy related concerns including vaginal bleeding/discharge, abdominal cramping/contractions and endorses fetal movement.    Of note while provider at bedside patient has episode and states "it's starting right now."  During this time patient visibly weak as hands lay by her side, but she is  responsive to name being called.  Heart rate in 70s with rapid decrease to 36-40s, 02 remains normal with no apparent respiratory distress noted. Patient recovers after about 90 seconds and able to speak without issues.     OB History    Gravida  6   Para  3   Term  2   Preterm  1   AB  2   Living  4     SAB  2   TAB      Ectopic      Multiple  1   Live Births  4           Past Medical History:  Diagnosis Date  . Anemia   . Postpartum care following vaginal delivery 02/06/2013  . Preterm labor   . VBAC, delivered, current hospitalization 02/06/2013    Past Surgical History:  Procedure Laterality Date  . CESAREAN SECTION  06/23/2011   Procedure: CESAREAN SECTION;  Surgeon: Marylynn Pearson;  Location: [facility];  Service: Gynecology;  Laterality: N/A;  . DILATION AND CURETTAGE OF UTERUS    . WISDOM TOOTH EXTRACTION      Family History  Problem Relation Age of Onset  . Alcohol abuse Mother   . Depression Mother   . Mental illness Mother   . Diabetes Father   . Heart disease Father   . Hyperlipidemia Father   . Hypertension Father   . Heart attack Father   . Depression Maternal Uncle   . Kidney disease Maternal Uncle   . Mental illness Maternal Uncle   . Depression Paternal Aunt   . Mental illness Maternal Grandmother     Social History   Tobacco Use  . Smoking status: Never  Smoker  . Smokeless tobacco: Never Used  Substance Use Topics  . Alcohol use: No  . Drug use: No    Allergies: No Known Allergies  Medications Prior to Admission  Medication Sig Dispense Refill Last Dose  . aspirin EC 81 MG tablet Take 81 mg by mouth daily.   07/28/2019 at 0700pm   . doxylamine, Sleep, (UNISOM) 25 MG tablet Take 25 mg by mouth at bedtime as needed.   Past Week at Unknown time  . Prenatal Vit-Fe Fumarate-FA (PRENATAL MULTIVITAMIN) TABS Take 1 tablet by mouth daily.     07/28/2019 at 0600pm  . diphenhydrAMINE (BENADRYL) 25 MG tablet Take 25 mg by mouth once.     Marland Kitchen ibuprofen (ADVIL,MOTRIN) 200 MG tablet Take 2-3 tablets (400-600 mg total) by mouth every 6 (six) hours.  0     Review of Systems  Constitutional: Negative for chills and fever.  Respiratory: Positive for shortness of breath. Negative for cough and chest  tightness.   Cardiovascular: Negative for chest pain.  Genitourinary: Negative for vaginal bleeding and vaginal discharge.  Neurological: Positive for dizziness and light-headedness. Negative for headaches.   Physical Exam   Blood pressure 114/64, pulse 78, temperature 97.9 F (36.6 C), temperature source Oral, SpO2 98 %, unknown if currently breastfeeding.  Physical Exam  Constitutional: She is oriented to person, place, and time. She appears well-developed and well-nourished. She appears distressed (During episode of PVC).  HENT:  Head: Normocephalic and atraumatic.  Eyes: Conjunctivae are normal.  Cardiovascular: Regular rhythm and normal heart sounds.  Respiratory: Effort normal and breath sounds normal.  GI: Soft. Bowel sounds are normal.  Musculoskeletal:        General: Normal range of motion.     Cervical back: Normal range of motion.  Neurological: She is alert and oriented to person, place, and time.  Skin: Skin is warm and dry.  Psychiatric: She has a normal mood and affect. Her behavior is normal.    Fetal Assessment 150 bpm, Mod Var, -Decels, +Accels Toco: None  MAU Course  No results found for this or any previous visit (from the past 24 hour(s)). DG CHEST PORT 1 VIEW  Result Date: 07/28/2019 CLINICAL DATA:  Shortness of breath. EXAM: PORTABLE CHEST 1 VIEW COMPARISON:  None. FINDINGS: The heart size and mediastinal contours are within normal limits. Both lungs are clear. The visualized skeletal structures are unremarkable. IMPRESSION: No active disease. Electronically Signed   By: Constance Holster M.D.   On: 07/28/2019 21:49   EKG read by Dr. Loletha Grayer. Fair  HR 80bpm QTC 442 No Stemi Frequent PVC: (~15 per minute)  MDM PE Labs: CBC, CMP, TSH EFM  Assessment and Plan  39 year old [id number]  SIUP at 25.6weeks Cat I FT SOB  -EKG and Chest Exam ordered while patient in triage. -Upon completion provider at bedside to discuss results.  -Patient gives HPI as per  above. -Informed that Dr. Hulan Fray would be consulted before further evaluation as patient may need to be transferred to [facility] for cardiology consult. -Patient without questions or concerns.  Maryann Conners MSN, CNM 07/28/2019, 9:56 PM   Reassessment (10:26 PM)  -Dr. Hulan Fray consulted and informed of patient status including EKG. -Recommendation made for admission with cardiology consult. -Dr. Jerral Bonito contacted and informed of Dr. Hulan Fray recommendation. *Requests placing CBC, CMP, and TSH as well as start IV with LR. *States she will come to evaluate patient. -Nurse updated on plan of care and instructed to inform  patient. -Care to be assumed by Dr. Pamala Hurry.  Maryann Conners MSN, CNM Advanced Practice Provider, [facility]

## 2019-07-28 NOTE — H&P (Addendum)
Chief complaint: Presyncope, fatigue and bradycardia  History of present illness: 39 year old G6 P2-1-2-4 at 25 weeks and 6 days with uncomplicated pregnancy who presents with increasingly frequent episodes of sudden onset chest pressure, shortness of breath and fatigue.  Patient notes similar symptoms intermittently and very mild in her prior pregnancies.  No previous cardiac work-up has been done.  Patient notes since this afternoon she will get episodes where she suddenly feels weak with heart palpitations and chest pressure.  She notes an irregular pulse with heartbeat in the 30s.  She then has presyncope where she needs to lay back and close her eyes.  The episodes can last anywhere between 5 and 35 minutes and can occur several times in an hour.  Patient notes no significant change to her usual active lifestyle today she did drink an extra cup of coffee this morning.  Past Medical History:  Diagnosis Date  . Anemia   . Postpartum care following vaginal delivery 02/06/2013  . Preterm labor   . VBAC, delivered, current hospitalization 02/06/2013    Past Surgical History:  Procedure Laterality Date  . CESAREAN SECTION  06/23/2011   Procedure: CESAREAN SECTION;  Surgeon: Marylynn Pearson;  Location: [facility];  Service: Gynecology;  Laterality: N/A;  . DILATION AND CURETTAGE OF UTERUS    . WISDOM TOOTH EXTRACTION      Meds: Prenatal vitamin, baby aspirin, Unisom as needed Allergies: No known drug allergies  Physical exam Vitals with BMI 07/28/2019 07/28/2019 07/28/2019  Height - - -  Weight - - -  BMI - - -  Systolic [id number]  Diastolic 72 54 61  Pulse 44 75 42   General: Well-appearing with no distress, talkative.  At the times when she has her "episode" patient becomes diaphoretic, places her hand over her left chest, lays her head back and closes her eyes.  During these episodes she is still able to talk but notes significant fatigue and does have some speech slurring. Cardiovascular:  Irregular rhythm.  Bradycardic Pulmonary: Clear to auscultation bilaterally Skin: Slightly clammy Abdomen: Gravid, nontender, no fundal tenderness, no right upper quadrant pain  Toco: No contractions FH: 150s, 10 x 10 accelerations, no decelerations, prep for gestational age  Chest x-ray: Normal EKG: Frequent PVCs  CBC    Component Value Date/Time   WBC 10.3 07/28/2019 2243   RBC 3.73 (L) 07/28/2019 2243   HGB 11.7 (L) 07/28/2019 2243   HCT 34.8 (L) 07/28/2019 2243   PLT 207 07/28/2019 2243   MCV 93.3 07/28/2019 2243   MCH 31.4 07/28/2019 2243   MCHC 33.6 07/28/2019 2243   RDW 12.2 07/28/2019 2243    CMP     Component Value Date/Time   NA 137 07/28/2019 2243   K 3.0 (L) 07/28/2019 2243   CL 106 07/28/2019 2243   CO2 21 (L) 07/28/2019 2243   GLUCOSE 106 (H) 07/28/2019 2243   BUN 6 07/28/2019 2243   CREATININE 0.40 (L) 07/28/2019 2243   CALCIUM 8.8 (L) 07/28/2019 2243   PROT 6.1 (L) 07/28/2019 2243   ALBUMIN 2.8 (L) 07/28/2019 2243   AST 19 07/28/2019 2243   ALT 17 07/28/2019 2243   ALKPHOS 56 07/28/2019 2243   BILITOT 0.3 07/28/2019 2243   GFRNONAA >60 07/28/2019 2243   GFRAA >60 07/28/2019 2243    Assessment and plan: 40 year old at 25 weeks and 6 days pregnancy with frequent PVCs, symptomatic with presyncope.  Unclear etiology will have cardiology assess.  Cardiology consult speaking to patient  now. -Replete potassium  Joyce Bauer 07/29/2019 12:04 AM   S/p cards consult- will admit for observation, telemetry o/n, possible echo in am. Cards to follow but ask to admit pt here.   Joyce Bauer 07/29/2019 12:22 AM

## 2019-07-28 NOTE — MAU Note (Signed)
Patient reports to MAU c/o SOB and heart arrhythmia. Pt denies symptoms of COVID she states her SOB is from her heart rhythm. No bleeding or LOF. +FM. [id number].  Pt reports this started today. Pt reports no diagnosis.

## 2019-07-29 ENCOUNTER — Encounter (HOSPITAL_COMMUNITY): Payer: Self-pay | Admitting: Obstetrics

## 2019-07-29 ENCOUNTER — Observation Stay (HOSPITAL_BASED_OUTPATIENT_CLINIC_OR_DEPARTMENT_OTHER): Payer: No Typology Code available for payment source

## 2019-07-29 ENCOUNTER — Other Ambulatory Visit: Payer: Self-pay

## 2019-07-29 DIAGNOSIS — I493 Ventricular premature depolarization: Secondary | ICD-10-CM | POA: Diagnosis not present

## 2019-07-29 DIAGNOSIS — R001 Bradycardia, unspecified: Secondary | ICD-10-CM | POA: Diagnosis not present

## 2019-07-29 DIAGNOSIS — R0602 Shortness of breath: Secondary | ICD-10-CM

## 2019-07-29 LAB — COMPREHENSIVE METABOLIC PANEL
ALT: 15 U/L (ref 0–44)
AST: 18 U/L (ref 15–41)
Albumin: 2.7 g/dL — ABNORMAL LOW (ref 3.5–5.0)
Alkaline Phosphatase: 52 U/L (ref 38–126)
Anion gap: 9 (ref 5–15)
BUN: <5 mg/dL — ABNORMAL LOW (ref 6–20)
CO2: 21 mmol/L — ABNORMAL LOW (ref 22–32)
Calcium: 8.5 mg/dL — ABNORMAL LOW (ref 8.9–10.3)
Chloride: 107 mmol/L (ref 98–111)
Creatinine, Ser: 0.42 mg/dL — ABNORMAL LOW (ref 0.44–1.00)
GFR calc Af Amer: >60 mL/min (ref >60)
GFR calc non Af Amer: >60 mL/min (ref >60)
Glucose, Bld: 94 mg/dL (ref 70–99)
Potassium: 3.7 mmol/L (ref 3.5–5.1)
Sodium: 137 mmol/L (ref 135–145)
Total Bilirubin: 0.2 mg/dL — ABNORMAL LOW (ref 0.3–1.2)
Total Protein: 5.6 g/dL — ABNORMAL LOW (ref 6.5–8.1)

## 2019-07-29 LAB — SARS CORONAVIRUS 2 (TAT 6-24 HRS): SARS Coronavirus 2: NEGATIVE

## 2019-07-29 LAB — ECHOCARDIOGRAM COMPLETE
Height: 65 in
Weight: 2848 oz

## 2019-07-29 LAB — ABO/RH: ABO/RH(D): O POS

## 2019-07-29 LAB — TYPE AND SCREEN
ABO/RH(D): O POS
Antibody Screen: NEGATIVE

## 2019-07-29 LAB — MAGNESIUM: Magnesium: 1.8 mg/dL (ref 1.7–2.4)

## 2019-07-29 MED ORDER — PRENATAL MULTIVITAMIN CH
1.0000 | ORAL_TABLET | Freq: Every day | ORAL | Status: DC
  Filled 2019-07-29: qty 1

## 2019-07-29 MED ORDER — ZOLPIDEM TARTRATE 5 MG PO TABS: 5.0000 mg | ORAL_TABLET | Freq: Every evening | ORAL | Status: DC | PRN

## 2019-07-29 MED ORDER — CALCIUM CARBONATE ANTACID 500 MG PO CHEW: 2.0000 | CHEWABLE_TABLET | ORAL | Status: DC | PRN

## 2019-07-29 MED ORDER — POTASSIUM CHLORIDE IN NACL 20-0.9 MEQ/L-% IV SOLN
INTRAVENOUS | Status: DC
  Filled 2019-07-29 (×2): qty 1000

## 2019-07-29 MED ORDER — METOPROLOL TARTRATE 25 MG PO TABS: 12.5000 mg | ORAL_TABLET | Freq: Two times a day (BID) | ORAL | 1 refills | Status: DC | PRN

## 2019-07-29 MED ORDER — DOCUSATE SODIUM 100 MG PO CAPS
100.0000 mg | ORAL_CAPSULE | Freq: Every day | ORAL | Status: DC
  Filled 2019-07-29: qty 1

## 2019-07-29 MED ORDER — POTASSIUM CHLORIDE CRYS ER 20 MEQ PO TBCR
40.0000 meq | EXTENDED_RELEASE_TABLET | Freq: Once | ORAL | Status: AC
  Administered 2019-07-29: 05:00:00 40 meq via ORAL
  Filled 2019-07-29 (×2): qty 2

## 2019-07-29 MED ORDER — ACETAMINOPHEN 325 MG PO TABS
650.0000 mg | ORAL_TABLET | ORAL | Status: DC | PRN
  Administered 2019-07-29: 15:00:00 650 mg via ORAL
  Filled 2019-07-29: qty 2

## 2019-07-29 MED ORDER — METOPROLOL TARTRATE 12.5 MG HALF TABLET
12.5000 mg | ORAL_TABLET | Freq: Two times a day (BID) | ORAL | Status: DC
  Administered 2019-07-29: 06:00:00 12.5 mg via ORAL
  Filled 2019-07-29 (×2): qty 1

## 2019-07-29 MED ORDER — POTASSIUM CHLORIDE 10 MEQ/100ML IV SOLN
10.0000 meq | INTRAVENOUS | Status: AC
  Administered 2019-07-29 (×3): 10 meq via INTRAVENOUS
  Filled 2019-07-29 (×3): qty 100

## 2019-07-29 NOTE — Progress Notes (Signed)
Echocardiogram 2D Echocardiogram has been performed.  Oneal Deputy Jawara Latorre 07/29/2019, 10:22 AM

## 2019-07-29 NOTE — Progress Notes (Signed)
Discussed with Dr. Rayann Heman DC home PRN BBlocker Nl echo Routine ob fu. Fu with cardiology

## 2019-07-29 NOTE — Consult Note (Signed)
ELECTROPHYSIOLOGY CONSULT NOTE    Referring Physician:  Dr Ronita Hipps  Admit Date: 07/28/2019  Reason for consultation:  PVCs  Joyce Bauer is a 39 y.o. female without significant cardiac history.  She is 25 weeks pregnancy.  EP is consulted by Dr Ronita Hipps for further evaluation of PVCs. The patient reports having some PVCs with her last pregnancy.  Afterwards, they mostly resolved.  She has rarely noted palpitations.  Denies prior syncope. Yesterday, she developed recurrent PVCs with SOB ,weakness and presyncope.  She had had increased caffeine yesterday am and had also been exposed to fumes at her home (her hardwoods had been restaned on Thursday). She noticed by [name] watch that her heart rate was 40s.  Peripheral pulse by her spouse also reviewed heart rates 40s. She prsented to [facility] and was found to have frequent PVCs as the likely cause.  She was observed overnight and received metoprolol with subsequent resolution.  Today, she denies symptoms of chest pain, shortness of breath, orthopnea, PND, lower extremity edema, syncope, or neurologic sequela. The patient is tolerating medications without difficulties and is otherwise without complaint today.   Past Medical History:  Diagnosis Date  . ADHD   . Anemia   . Anxiety   . Postpartum care following vaginal delivery 02/06/2013  . Preterm labor   . PVC's (premature ventricular contractions)   . VBAC, delivered, current hospitalization 02/06/2013   Past Surgical History:  Procedure Laterality Date  . CESAREAN SECTION  06/23/2011   Procedure: CESAREAN SECTION;  Surgeon: Marylynn Pearson;  Location: [facility];  Service: Gynecology;  Laterality: N/A;  . DILATION AND CURETTAGE OF UTERUS    . WISDOM TOOTH EXTRACTION      . docusate sodium  100 mg Oral Daily  . metoprolol tartrate  12.5 mg Oral BID  . prenatal multivitamin  1 tablet Oral Q1200     No Known Allergies  Social History   Socioeconomic History  . Marital status:  Married    Spouse name: Not on file  . Number of children: Not on file  . Years of education: Not on file  . Highest education level: Not on file  Occupational History  . Not on file  Tobacco Use  . Smoking status: Never Smoker  . Smokeless tobacco: Never Used  Substance and Sexual Activity  . Alcohol use: No  . Drug use: No  . Sexual activity: Never    Birth control/protection: None  Other Topics Concern  . Not on file  Social History Narrative   Lives in [place]   Works as a Journalist, newspaper in [place] Strain:   . Difficulty of Paying Living Expenses: Not on file  Food Insecurity:   . Worried About Charity fundraiser in the Last Year: Not on file  . Ran Out of Food in the Last Year: Not on file  Transportation Needs:   . Lack of Transportation (Medical): Not on file  . Lack of Transportation (Non-Medical): Not on file  Physical Activity:   . Days of Exercise per Week: Not on file  . Minutes of Exercise per Session: Not on file  Stress:   . Feeling of Stress : Not on file  Social Connections:   . Frequency of Communication with Friends and Family: Not on file  . Frequency of Social Gatherings with Friends and Family: Not on file  . Attends Religious Services: Not on file  . Active  Member of Clubs or Organizations: Not on file  . Attends Archivist Meetings: Not on file  . Marital Status: Not on file  Intimate Partner Violence:   . Fear of Current or Ex-Partner: Not on file  . Emotionally Abused: Not on file  . Physically Abused: Not on file  . Sexually Abused: Not on file    Family History  Problem Relation Age of Onset  . Alcohol abuse Mother   . Depression Mother   . Mental illness Mother   . Diabetes Father   . Heart disease Father   . Hyperlipidemia Father   . Hypertension Father   . Heart attack Father   . Depression Maternal Uncle   . Kidney disease Maternal Uncle   . Mental illness Maternal  Uncle   . Depression Paternal Aunt   . Mental illness Maternal Grandmother   . Sudden death Maternal Grandfather     ROS- All systems are reviewed and negative except as per the HPI above  Physical Exam: Telemetry: Vitals:   07/29/19 0615 07/29/19 0826 07/29/19 0839 07/29/19 1150  BP: (!) 99/43 (!) 87/40 (!) 110/58 (!) 107/53  Pulse: 73 66 67   Resp: 16 18  18   Temp:  98.1 F (36.7 C)  98.2 F (36.8 C)  TempSrc:  Oral  Oral  SpO2:  100%  96%  Weight:      Height:        GEN- The patient is well appearing, alert and oriented x 3 today.   Head- normocephalic, atraumatic Eyes-  Sclera clear, conjunctiva pink Ears- hearing intact Oropharynx- clear Neck- supple,   Lungs-   normal work of breathing Heart- Regular rate and rhythm  GI- soft, NT, ND, + BS Extremities- no clubbing, cyanosis, or edema MS- no significant deformity or atrophy Skin- no rash or lesion Psych- euthymic mood, full affect Neuro- strength and sensation are intact  EKG-  Sinus rhythm with PVCs (LBB inferior axis with precordial transition late)  Labs:   Lab Results  Component Value Date   WBC 10.3 07/28/2019   HGB 11.7 (L) 07/28/2019   HCT 34.8 (L) 07/28/2019   MCV 93.3 07/28/2019   PLT 207 07/28/2019    Recent Labs  Lab 07/29/19 0435  NA 137  K 3.7  CL 107  CO2 21*  BUN <5*  CREATININE 0.42*  CALCIUM 8.5*  PROT 5.6*  BILITOT 0.2*  ALKPHOS 52  ALT 15  AST 18  GLUCOSE 94   Echo:  EF 65%, no LVH, no WMA, normal RV size/ function, no significant valve disease  ASSESSMENT AND PLAN:   1. Outflow tract PVCs  The patient presents with symptomatic PVCs. ekg reveals that they arise likely from the RVOT, though I cannot exclude LVOT origin. She has no structural heart disease by echo.  No high risk ekg findings. She did receive improvement overnight with metoprolol I would therefore advise metoprolol 12.5mg  BID prn on discharge. We also discussed caffeine avoidance, stress reduction,  adequate sleep, adequate hydration, and regular exercise and important. I do not feel that outpatient monitoring is necessary at this time.  I did discuss KardiaMobile and AppleWatch v 6 as options for her to better characterize her symptoms at home.  If she continues to have PVCs after delivery, we could consider holter monitoring and ablation at that time.  Ok to discharge today. Follow-up with me virtually in 2 weeks.   Thompson Grayer, MD 07/29/2019  1:09 PM

## 2019-07-29 NOTE — Progress Notes (Signed)
Multiple attempts to contact cardiology have been made. Dr. Pamala Hurry and [facility] contacted to attempt contact with cardiology.

## 2019-07-29 NOTE — Treatment Plan (Signed)
I was notified that the patient was having recurrence of her more pronounced symptoms of lightheadedness and palpitations. Telemetry monitoring at that time demonstrated ventricular bigeminy that correlated with her symptoms.  On my re-evaluation, the patient reports improvement of her symptoms at the present time. I discussed different treatment options with her. At this time, we will try low dose metoprolol with ongoing monitoring.

## 2019-07-29 NOTE — Progress Notes (Signed)
Pt discharged after discharge instructions given. All questions answered. Pt discharged in stable condition accompanied by RN and all belongings.IV and telemetry were discontinued.

## 2019-07-29 NOTE — Progress Notes (Addendum)
Patient ID: TEMPRANCE BENTANCOURT, female   DOB: 03-10-1981, 39 y.o.   MRN: [id number] HD #1 Cardiac arrhythmia 26w IUP  S: No complaints Good FM. No SOB or CP at this time Had one episode of diaphoresis and slight dizziness this am No LOF or bleeding Just started Metroprolol Currently on telemetry, had cardiac echo today O; BP (!) 107/53 (BP Location: Left Arm)   Pulse 67   Temp 98.2 F (36.8 C) (Oral)   Resp 18   Ht 5\' 5"  (1.651 m)   Wt 80.7 kg   SpO2 96%   BMI 29.62 kg/m   WDWN WF NAD NCAT Lungs: CTA CV: RRR Abd: gravid NT Neuro: non focal Skin: intact  FHR 150s, BTBV 5-25, rare mild variable Reassuring and appropriate for GA  A: 26wk IUP New onset cardiac arrhythmia with presyncopal episode (witnessed in MAU) AMA Hypokalemia- corrected, rpt nl. P: Metoprolol per Cardiology(agree) Serial fetal growth monitoring on BBlockers discussed Triage/wu per cardiology Ck echo Continue telemetry

## 2019-07-29 NOTE — Progress Notes (Signed)
Pt diaphoretic, short of breath and feeling weak. Telemetry monitor on the patient is showing bigeminy for periods of 2-3 minutes.

## 2019-07-29 NOTE — Consult Note (Signed)
Cardiology Consultation:   Patient ID: NYLIAH REPKO MRN: [id number]; DOB: 22-Sep-1980  Admit date: 07/28/2019 Date of Consult: 07/29/2019  Primary Care Provider: Brien Few, MD Primary Cardiologist: No primary care provider on file.   Primary Electrophysiologist:  None     Patient Profile:   AHNYA SATURDAY is a 39 y.o. female who is [redacted] weeks pregnant and has an otherwise unremarkable past medical history who is being seen today for the evaluation of bradycardia and PVCs at the request of Dr. Pamala Hurry.   History of Present Illness:   OZA BERNARDY is a 39 y.o. female who is [redacted] weeks pregnant and has an otherwise unremarkable past medical history who is being seen today for the evaluation of bradycardia and PVCs at the request of Dr. Pamala Hurry.   The patient has no significant medical or cardiac history. She reports that over the past few weeks she has been experiencing intermittent palpitations and irregularity of her HR. These symptoms were transient until today, when she had a more prolonged episode that is associated with other symptoms including dyspnea, weakness and presyncope. She has not lost consciousnes with these episodes. She states that her HR has been in the 30s by her watch today.  The patient presented to the [facility] ED this evening with these symptoms. BP was 110-140s. Labs included K 3.0 with otherwise normal CMP and CBC and TSH.  While in the ED, the patient was witnessed to have multiple presyncopal episodes at which time her HR was reportedly in the 30s. She was not on telemetry during these episodes, but subsequent 12-lead rhythm strip was performed and showed sinus rhythm with HR in the 80s with frequent PVCs (up to every 5th beat).   Cardiology was called for further assessment. On my evaluation, the patient provides the above history and states that she now feels better than she did. She reports that at the time of the documented ECG rhythm strip  (showing HR 80s with frequent PVCs), her symptoms were not typical of her recent, severe symptoms.  She reports that she had similar symptoms with prior pregnancies. She is otherwise active and exercises at [facility] without any limiting symptoms. She works as a Public librarian.   Heart Pathway Score:     Past Medical History:  Diagnosis Date  . Anemia   . Postpartum care following vaginal delivery 02/06/2013  . Preterm labor   . VBAC, delivered, current hospitalization 02/06/2013    Past Surgical History:  Procedure Laterality Date  . CESAREAN SECTION  06/23/2011   Procedure: CESAREAN SECTION;  Surgeon: Marylynn Pearson;  Location: [facility];  Service: Gynecology;  Laterality: N/A;  . DILATION AND CURETTAGE OF UTERUS    . WISDOM TOOTH EXTRACTION       Home Medications:  Prior to Admission medications   Medication Sig Start Date End Date Taking? Authorizing Provider  aspirin EC 81 MG tablet Take 81 mg by mouth daily.   Yes [provider]  doxylamine, Sleep, (UNISOM) 25 MG tablet Take 25 mg by mouth at bedtime as needed.   Yes [provider]  Prenatal Vit-Fe Fumarate-FA (PRENATAL MULTIVITAMIN) TABS Take 1 tablet by mouth daily.     Yes [provider]  diphenhydrAMINE (BENADRYL) 25 MG tablet Take 25 mg by mouth once.    [provider]  ibuprofen (ADVIL,MOTRIN) 200 MG tablet Take 2-3 tablets (400-600 mg total) by mouth every 6 (six) hours. 02/08/13   Artelia Laroche, CNM  Inpatient Medications: Scheduled Meds:  Continuous Infusions:  PRN Meds:   Allergies:   No Known Allergies  Social History:   Social History   Socioeconomic History  . Marital status: Married    Spouse name: Not on file  . Number of children: Not on file  . Years of education: Not on file  . Highest education level: Not on file  Occupational History  . Not on file  Tobacco Use  . Smoking status: Never Smoker  . Smokeless tobacco: Never Used  Substance and Sexual Activity    . Alcohol use: No  . Drug use: No  . Sexual activity: Never    Birth control/protection: None  Other Topics Concern  . Not on file  Social History Narrative  . Not on file   Social Determinants of Health   Financial Resource Strain:   . Difficulty of Paying Living Expenses: Not on file  Food Insecurity:   . Worried About Charity fundraiser in the Last Year: Not on file  . Ran Out of Food in the Last Year: Not on file  Transportation Needs:   . Lack of Transportation (Medical): Not on file  . Lack of Transportation (Non-Medical): Not on file  Physical Activity:   . Days of Exercise per Week: Not on file  . Minutes of Exercise per Session: Not on file  Stress:   . Feeling of Stress : Not on file  Social Connections:   . Frequency of Communication with Friends and Family: Not on file  . Frequency of Social Gatherings with Friends and Family: Not on file  . Attends Religious Services: Not on file  . Active Member of Clubs or Organizations: Not on file  . Attends Archivist Meetings: Not on file  . Marital Status: Not on file  Intimate Partner Violence:   . Fear of Current or Ex-Partner: Not on file  . Emotionally Abused: Not on file  . Physically Abused: Not on file  . Sexually Abused: Not on file    Family History:    Family History  Problem Relation Age of Onset  . Alcohol abuse Mother   . Depression Mother   . Mental illness Mother   . Diabetes Father   . Heart disease Father   . Hyperlipidemia Father   . Hypertension Father   . Heart attack Father   . Depression Maternal Uncle   . Kidney disease Maternal Uncle   . Mental illness Maternal Uncle   . Depression Paternal Aunt   . Mental illness Maternal Grandmother      ROS:  Please see the history of present illness.  All other ROS reviewed and negative.     Physical Exam/Data:   Vitals:   07/28/19 2231 07/28/19 2301 07/28/19 2318 07/28/19 2331  BP: 127/61 (!) 113/54 (!) 144/72 105/62  Pulse:  (!) 42 75 (!) 44 75  Temp:      TempSrc:      SpO2:       No intake or output data in the 24 hours ending 07/29/19 0000 Last 3 Weights 02/06/2013 09/06/2012 06/24/2011  Weight (lbs) 199 lb 167 lb 4 oz 201 lb  Weight (kg) 90.266 kg 75.864 kg 91.173 kg     There is no height or weight on file to calculate BMI.  General:  Well nourished, well developed, in no acute distress HEENT: normal Neck: no JVD Cardiac:  normal S1, S2; RRR; no murmur  Lungs:  clear to auscultation  bilaterally, no wheezing, rhonchi or rales  Abd: soft, nontender, no hepatomegaly  Ext: no edema Musculoskeletal:  No deformities, BUE and BLE strength normal and equal Skin: warm and dry  Neuro:  No focal abnormalities noted Psych:  Normal affect   EKG:  The EKG was personally reviewed and demonstrates:  Sinus rhythm with HR 80s and frequent PVCs.  Telemetry:  Not yet available  Relevant CV Studies: None available   Laboratory Data:  High Sensitivity Troponin:  No results for input(s): TROPONINIHS in the last 720 hours.   Chemistry Recent Labs  Lab 07/28/19 2243  NA 137  K 3.0*  CL 106  CO2 21*  GLUCOSE 106*  BUN 6  CREATININE 0.40*  CALCIUM 8.8*  GFRNONAA >60  GFRAA >60  ANIONGAP 10    Recent Labs  Lab 07/28/19 2243  PROT 6.1*  ALBUMIN 2.8*  AST 19  ALT 17  ALKPHOS 56  BILITOT 0.3   Hematology Recent Labs  Lab 07/28/19 2243  WBC 10.3  RBC 3.73*  HGB 11.7*  HCT 34.8*  MCV 93.3  MCH 31.4  MCHC 33.6  RDW 12.2  PLT 207   BNPNo results for input(s): BNP, PROBNP in the last 168 hours.  DDimer No results for input(s): DDIMER in the last 168 hours.   Radiology/Studies:  DG CHEST PORT 1 VIEW  Result Date: 07/28/2019 CLINICAL DATA:  Shortness of breath. EXAM: PORTABLE CHEST 1 VIEW COMPARISON:  None. FINDINGS: The heart size and mediastinal contours are within normal limits. Both lungs are clear. The visualized skeletal structures are unremarkable. IMPRESSION: No active disease.  Electronically Signed   By: Constance Holster M.D.   On: 07/28/2019 21:49         Assessment and Plan:   Presyncope, palpitations PVCs Reported bradycardia The patient has no cardiac history but has experienced symptoms of palpitations and HR irregularity with her current and prior pregnancies. She presented to the ED tonight after a prolonged and more pronounced episode of HR irregularity associated with presyncope and dyspnea. By report from OB-Gyn team, the patient was witnessed to have multiple presyncopal episodes with concomitant bradycardia to the 30s. Unfortunately, no telemetry/ECG documentation of these episodes is available. A 12 lead rhythm strip was subsequently performed and showed normal rate with frequent, monomorphic PVCs (up to 10-20% of evaluation time) that appear to be of outflow tract origin. However, she states that she was not having her severe symptoms at the time of this ECG. Therefore, at this time, it is difficult to determine whether her presyncopal episodes and reported bradycardia are due  to these PVCs (that may not have been included in HR count by the vitals machine) or some other process. Therefore, she would benefit from ongoing cardiac monitoring and basic workup.  -Continue to monitor on cardiac telemetry. Please obtain repeat 12 lead ECG for any new/different symptoms. -Agree with potassium repletion, as hypokalemia (in addition to pregnancy) may be contributing to her more frequent ectopy.  -Echocardiogram ordered to assess baseline structure and function -Would benefit from ambulatory ECG monitor to quantify PVC burden.  -Can determine treatment options based on above workup.      For questions or updates, please contact [facility] Please consult [email or web address] for contact info under     Signed, Nila Nephew, MD  07/29/2019 12:00 AM

## 2019-08-13 ENCOUNTER — Telehealth: Payer: Self-pay

## 2019-08-14 ENCOUNTER — Encounter: Payer: Self-pay | Admitting: Internal Medicine

## 2019-08-14 ENCOUNTER — Telehealth (INDEPENDENT_AMBULATORY_CARE_PROVIDER_SITE_OTHER): Payer: PRIVATE HEALTH INSURANCE | Admitting: Internal Medicine

## 2019-08-14 VITALS — BP 104/68 | HR 67 | Ht 65.0 in | Wt 180.0 lb

## 2019-08-14 DIAGNOSIS — I493 Ventricular premature depolarization: Secondary | ICD-10-CM

## 2019-08-14 NOTE — Progress Notes (Signed)
Electrophysiology TeleHealth Note   Due to national recommendations of social distancing due to COVID 19, an audio/video telehealth visit is felt to be most appropriate for this patient at this time.  See MyChart message from today for the patient's consent to telehealth for [facility].  Date:  08/14/2019   ID:  Robert Bellow, DOB February 27, 1981, MRN [id number]  Location: patient's home  Provider location:  [facility]  Evaluation Performed: Follow-up visit  PCP:  Brien Few, MD   Electrophysiologist:  Dr Rayann Heman  Chief Complaint:  palpitations  History of Present Illness:    Joyce Bauer is a 39 y.o. female who presents via telehealth conferencing today.  Since last being seen in our clinic, the patient reports doing very well.  Her palpitations have been improved since she left the hospital.  She has taken prn metoprolol only once.  Today, she denies symptoms of palpitations, chest pain, shortness of breath,  lower extremity edema, dizziness, presyncope, or syncope.  The patient is otherwise without complaint today.  The patient denies symptoms of fevers, chills, cough, or new SOB worrisome for COVID 19.  Past Medical History:  Diagnosis Date  . ADHD   . Anemia   . Anxiety   . Postpartum care following vaginal delivery 02/06/2013  . Preterm labor   . PVC's (premature ventricular contractions)   . VBAC, delivered, current hospitalization 02/06/2013    Past Surgical History:  Procedure Laterality Date  . CESAREAN SECTION  06/23/2011   Procedure: CESAREAN SECTION;  Surgeon: Marylynn Pearson;  Location: [facility];  Service: Gynecology;  Laterality: N/A;  . DILATION AND CURETTAGE OF UTERUS    . WISDOM TOOTH EXTRACTION      Current Outpatient Medications  Medication Sig Dispense Refill  . aspirin EC 81 MG tablet Take 81 mg by mouth daily.    Marland Kitchen doxylamine, Sleep, (UNISOM) 25 MG tablet Take 25 mg by mouth at bedtime as needed for sleep.     . metoprolol tartrate  (LOPRESSOR) 25 MG tablet Take 0.5 tablets (12.5 mg total) by mouth 2 (two) times daily as needed. 30 tablet 1  . Prenatal Vit-Fe Fumarate-FA (PRENATAL MULTIVITAMIN) TABS Take 1 tablet by mouth daily.       No current facility-administered medications for this visit.    Allergies:   Patient has no known allergies.   Social History:  The patient  reports that she has never smoked. She has never used smokeless tobacco. She reports that she does not drink alcohol or use drugs.   Family History:  The patient's  family history includes Alcohol abuse in her mother; Depression in her maternal uncle, mother, and paternal aunt; Diabetes in her father; Heart attack in her father; Heart disease in her father; Hyperlipidemia in her father; Hypertension in her father; Kidney disease in her maternal uncle; Mental illness in her maternal grandmother, maternal uncle, and mother; Sudden death in her maternal grandfather.   ROS:  Please see the history of present illness.   All other systems are personally reviewed and negative.    Exam:    Vital Signs:  BP 104/68   Pulse 67   Ht 5\' 5"  (1.651 m)   Wt 180 lb (81.6 kg)   BMI 29.95 kg/m   Well sounding and appearing, alert and conversant, regular work of breathing,  good skin color Eyes- anicteric, neuro- grossly intact, skin- no apparent rash or lesions or cyanosis, mouth- oral mucosa is pink  Labs/Other Tests and  Data Reviewed:    Recent Labs: 07/28/2019: Hemoglobin 11.7; Platelets 207; TSH 1.368 07/29/2019: ALT 15; BUN <5; Creatinine, Ser 0.42; Magnesium 1.8; Potassium 3.7; Sodium 137   Wt Readings from Last 3 Encounters:  08/14/19 180 lb (81.6 kg)  07/29/19 178 lb (80.7 kg)  02/06/13 199 lb (90.3 kg)       ASSESSMENT & PLAN:    1.  PVCs Outflow tract in nature Much improved Have occurred previously only during pregnancy Currently controlled with low dose metoprolol which she rarely requires We discussed adequate hydration, stress  avoidance, caffeine reduction, etc today   Follow-up:  She wishes to follow-up prn   Patient Risk:  after full review of this patients clinical status, I feel that they are at moderate risk at this time.  Today, I have spent 15 minutes with the patient with telehealth technology discussing arrhythmia management .    Army Fossa, MD  08/14/2019 11:51 AM     [facility] [address] [place]  [place] [phone] (office) [phone] (fax)

## 2019-08-16 ENCOUNTER — Telehealth: Payer: PRIVATE HEALTH INSURANCE | Admitting: Internal Medicine

## 2019-10-04 LAB — OB RESULTS CONSOLE GBS: GBS: NEGATIVE

## 2019-10-24 ENCOUNTER — Telehealth (HOSPITAL_COMMUNITY): Payer: Self-pay | Admitting: *Deleted

## 2019-10-24 NOTE — Telephone Encounter (Signed)
Preadmission screen  

## 2019-10-25 ENCOUNTER — Telehealth (HOSPITAL_COMMUNITY): Payer: Self-pay | Admitting: *Deleted

## 2019-10-25 ENCOUNTER — Encounter (HOSPITAL_COMMUNITY): Payer: Self-pay | Admitting: *Deleted

## 2019-10-25 NOTE — Telephone Encounter (Signed)
Preadmission screen  

## 2019-10-26 ENCOUNTER — Other Ambulatory Visit: Payer: Self-pay | Admitting: Obstetrics and Gynecology

## 2019-10-29 ENCOUNTER — Other Ambulatory Visit (HOSPITAL_COMMUNITY)
Admission: RE | Admit: 2019-10-29 | Discharge: 2019-10-29 | Disposition: A | Discharge status: Home / Self Care | Payer: No Typology Code available for payment source | Source: Ambulatory Visit | Attending: Obstetrics and Gynecology | Admitting: Obstetrics and Gynecology

## 2019-10-29 LAB — SARS CORONAVIRUS 2 (TAT 6-24 HRS): SARS Coronavirus 2: NEGATIVE

## 2019-10-30 ENCOUNTER — Inpatient Hospital Stay (HOSPITAL_COMMUNITY): Payer: No Typology Code available for payment source | Admitting: Anesthesiology

## 2019-10-30 ENCOUNTER — Other Ambulatory Visit: Payer: Self-pay

## 2019-10-30 ENCOUNTER — Inpatient Hospital Stay (HOSPITAL_COMMUNITY)
Admission: AD | Admit: 2019-10-30 | Discharge: 2019-10-31 | DRG: 807 | Disposition: A | Discharge status: Home / Self Care | Payer: No Typology Code available for payment source | Attending: Obstetrics and Gynecology | Admitting: Obstetrics and Gynecology

## 2019-10-30 ENCOUNTER — Encounter (HOSPITAL_COMMUNITY): Payer: Self-pay | Admitting: Obstetrics and Gynecology

## 2019-10-30 ENCOUNTER — Inpatient Hospital Stay (HOSPITAL_COMMUNITY): Payer: No Typology Code available for payment source

## 2019-10-30 DIAGNOSIS — Z20822 Contact with and (suspected) exposure to covid-19: Secondary | ICD-10-CM | POA: Diagnosis present

## 2019-10-30 DIAGNOSIS — O26893 Other specified pregnancy related conditions, third trimester: Secondary | ICD-10-CM | POA: Diagnosis present

## 2019-10-30 DIAGNOSIS — Z3A39 39 weeks gestation of pregnancy: Secondary | ICD-10-CM | POA: Diagnosis not present

## 2019-10-30 DIAGNOSIS — O34211 Maternal care for low transverse scar from previous cesarean delivery: Principal | ICD-10-CM | POA: Diagnosis present

## 2019-10-30 DIAGNOSIS — Z3490 Encounter for supervision of normal pregnancy, unspecified, unspecified trimester: Secondary | ICD-10-CM | POA: Diagnosis present

## 2019-10-30 DIAGNOSIS — O34219 Maternal care for unspecified type scar from previous cesarean delivery: Secondary | ICD-10-CM | POA: Diagnosis not present

## 2019-10-30 LAB — RPR: RPR Ser Ql: NONREACTIVE

## 2019-10-30 LAB — TYPE AND SCREEN
ABO/RH(D): O POS
Antibody Screen: NEGATIVE

## 2019-10-30 LAB — CBC
HCT: 36.9 % (ref 36.0–46.0)
Hemoglobin: 12 g/dL (ref 12.0–15.0)
MCH: 30.3 pg (ref 26.0–34.0)
MCHC: 32.5 g/dL (ref 30.0–36.0)
MCV: 93.2 fL (ref 80.0–100.0)
Platelets: 200 10*3/uL (ref 150–400)
RBC: 3.96 MIL/uL (ref 3.87–5.11)
RDW: 13.1 % (ref 11.5–15.5)
WBC: 6.9 10*3/uL (ref 4.0–10.5)
nRBC: 0 % (ref 0.0–0.2)

## 2019-10-30 MED ORDER — EPHEDRINE 5 MG/ML INJ: 10.0000 mg | INTRAVENOUS | Status: DC | PRN

## 2019-10-30 MED ORDER — WITCH HAZEL-GLYCERIN EX PADS: 1.0000 "application " | MEDICATED_PAD | CUTANEOUS | Status: DC | PRN

## 2019-10-30 MED ORDER — SIMETHICONE 80 MG PO CHEW: 80.0000 mg | CHEWABLE_TABLET | ORAL | Status: DC | PRN

## 2019-10-30 MED ORDER — ZOLPIDEM TARTRATE 5 MG PO TABS: 5.0000 mg | ORAL_TABLET | Freq: Every evening | ORAL | Status: DC | PRN

## 2019-10-30 MED ORDER — OXYTOCIN 40 UNITS IN NORMAL SALINE INFUSION - SIMPLE MED
2.5000 [IU]/h | INTRAVENOUS | Status: DC
  Administered 2019-10-30: 2.5 [IU]/h via INTRAVENOUS

## 2019-10-30 MED ORDER — DIBUCAINE (PERIANAL) 1 % EX OINT: 1.0000 "application " | TOPICAL_OINTMENT | CUTANEOUS | Status: DC | PRN

## 2019-10-30 MED ORDER — ACETAMINOPHEN 325 MG PO TABS: 650.0000 mg | ORAL_TABLET | ORAL | Status: DC | PRN

## 2019-10-30 MED ORDER — OXYCODONE-ACETAMINOPHEN 5-325 MG PO TABS: 1.0000 | ORAL_TABLET | ORAL | Status: DC | PRN

## 2019-10-30 MED ORDER — LIDOCAINE-EPINEPHRINE (PF) 2 %-1:200000 IJ SOLN
INTRAMUSCULAR | Status: DC | PRN
  Administered 2019-10-30: 3 mL via EPIDURAL
  Administered 2019-10-30: 2 mL via EPIDURAL

## 2019-10-30 MED ORDER — TETANUS-DIPHTH-ACELL PERTUSSIS 5-2.5-18.5 LF-MCG/0.5 IM SUSP: 0.5000 mL | Freq: Once | INTRAMUSCULAR | Status: DC

## 2019-10-30 MED ORDER — LIDOCAINE HCL (PF) 1 % IJ SOLN: 30.0000 mL | INTRAMUSCULAR | Status: DC | PRN

## 2019-10-30 MED ORDER — LACTATED RINGERS IV SOLN: 500.0000 mL | INTRAVENOUS | Status: DC | PRN

## 2019-10-30 MED ORDER — METHYLERGONOVINE MALEATE 0.2 MG PO TABS: 0.2000 mg | ORAL_TABLET | ORAL | Status: DC | PRN

## 2019-10-30 MED ORDER — FENTANYL-BUPIVACAINE-NACL 0.5-0.125-0.9 MG/250ML-% EP SOLN
12.0000 mL/h | EPIDURAL | Status: DC | PRN
  Filled 2019-10-30: qty 250

## 2019-10-30 MED ORDER — OXYCODONE-ACETAMINOPHEN 5-325 MG PO TABS: 2.0000 | ORAL_TABLET | ORAL | Status: DC | PRN

## 2019-10-30 MED ORDER — IBUPROFEN 600 MG PO TABS
600.0000 mg | ORAL_TABLET | Freq: Four times a day (QID) | ORAL | Status: DC
  Administered 2019-10-30 – 2019-10-31 (×4): 600 mg via ORAL
  Filled 2019-10-30 (×4): qty 1

## 2019-10-30 MED ORDER — PHENYLEPHRINE 40 MCG/ML (10ML) SYRINGE FOR IV PUSH (FOR BLOOD PRESSURE SUPPORT)
80.0000 ug | PREFILLED_SYRINGE | INTRAVENOUS | Status: DC | PRN
  Filled 2019-10-30: qty 10

## 2019-10-30 MED ORDER — ONDANSETRON HCL 4 MG/2ML IJ SOLN: 4.0000 mg | INTRAMUSCULAR | Status: DC | PRN

## 2019-10-30 MED ORDER — TERBUTALINE SULFATE 1 MG/ML IJ SOLN: 0.2500 mg | Freq: Once | INTRAMUSCULAR | Status: DC | PRN

## 2019-10-30 MED ORDER — SOD CITRATE-CITRIC ACID 500-334 MG/5ML PO SOLN: 30.0000 mL | ORAL | Status: DC | PRN

## 2019-10-30 MED ORDER — METHYLERGONOVINE MALEATE 0.2 MG/ML IJ SOLN: 0.2000 mg | INTRAMUSCULAR | Status: DC | PRN

## 2019-10-30 MED ORDER — LACTATED RINGERS IV SOLN
500.0000 mL | Freq: Once | INTRAVENOUS | Status: AC
  Administered 2019-10-30: 500 mL via INTRAVENOUS

## 2019-10-30 MED ORDER — ONDANSETRON HCL 4 MG PO TABS: 4.0000 mg | ORAL_TABLET | ORAL | Status: DC | PRN

## 2019-10-30 MED ORDER — DIPHENHYDRAMINE HCL 25 MG PO CAPS: 25.0000 mg | ORAL_CAPSULE | Freq: Four times a day (QID) | ORAL | Status: DC | PRN

## 2019-10-30 MED ORDER — OXYTOCIN BOLUS FROM INFUSION
500.0000 mL | Freq: Once | INTRAVENOUS | Status: AC
  Administered 2019-10-30: 500 mL via INTRAVENOUS

## 2019-10-30 MED ORDER — DIPHENHYDRAMINE HCL 50 MG/ML IJ SOLN: 12.5000 mg | INTRAMUSCULAR | Status: DC | PRN

## 2019-10-30 MED ORDER — PRENATAL MULTIVITAMIN CH
1.0000 | ORAL_TABLET | Freq: Every day | ORAL | Status: DC
  Administered 2019-10-31: 1 via ORAL
  Filled 2019-10-30: qty 1

## 2019-10-30 MED ORDER — OXYTOCIN 40 UNITS IN NORMAL SALINE INFUSION - SIMPLE MED
1.0000 m[IU]/min | INTRAVENOUS | Status: DC
  Administered 2019-10-30: 2 m[IU]/min via INTRAVENOUS
  Filled 2019-10-30: qty 1000

## 2019-10-30 MED ORDER — PHENYLEPHRINE 40 MCG/ML (10ML) SYRINGE FOR IV PUSH (FOR BLOOD PRESSURE SUPPORT)
80.0000 ug | PREFILLED_SYRINGE | INTRAVENOUS | Status: DC | PRN
  Administered 2019-10-30 (×2): 80 ug via INTRAVENOUS

## 2019-10-30 MED ORDER — COCONUT OIL OIL
1.0000 "application " | TOPICAL_OIL | Status: DC | PRN
  Administered 2019-10-31: 1 via TOPICAL

## 2019-10-30 MED ORDER — LACTATED RINGERS IV SOLN: INTRAVENOUS | Status: DC

## 2019-10-30 MED ORDER — SODIUM CHLORIDE (PF) 0.9 % IJ SOLN
INTRAMUSCULAR | Status: DC | PRN
  Administered 2019-10-30: 12 mL/h via EPIDURAL

## 2019-10-30 MED ORDER — BENZOCAINE-MENTHOL 20-0.5 % EX AERO
1.0000 "application " | INHALATION_SPRAY | CUTANEOUS | Status: DC | PRN
  Administered 2019-10-30: 1 via TOPICAL
  Filled 2019-10-30: qty 56

## 2019-10-30 MED ORDER — SENNOSIDES-DOCUSATE SODIUM 8.6-50 MG PO TABS
2.0000 | ORAL_TABLET | ORAL | Status: DC
  Administered 2019-10-30: 2 via ORAL
  Filled 2019-10-30: qty 2

## 2019-10-30 MED ORDER — ONDANSETRON HCL 4 MG/2ML IJ SOLN: 4.0000 mg | Freq: Four times a day (QID) | INTRAMUSCULAR | Status: DC | PRN

## 2019-10-30 NOTE — Anesthesia Procedure Notes (Signed)
Epidural Patient location during procedure: OB Start time: 10/30/2019 1:05 PM End time: 10/30/2019 1:20 PM  Staffing Anesthesiologist: Freddrick March, MD Performed: anesthesiologist   Preanesthetic Checklist Completed: patient identified, IV checked, risks and benefits discussed, monitors and equipment checked, pre-op evaluation and timeout performed  Epidural Patient position: sitting Prep: DuraPrep and site prepped and draped Patient monitoring: continuous pulse ox, blood pressure, heart rate and cardiac monitor Approach: midline Location: L3-L4 Injection technique: LOR air  Needle:  Needle type: Tuohy  Needle gauge: 17 G Needle length: 9 cm Needle insertion depth: 4 cm Catheter type: closed end flexible Catheter size: 19 Gauge Catheter at skin depth: 10 cm Test dose: negative  Assessment Sensory level: T8 Events: blood not aspirated, injection not painful, no injection resistance, no paresthesia and negative IV test  Additional Notes Patient identified. Risks/Benefits/Options discussed with patient including but not limited to bleeding, infection, nerve damage, paralysis, failed block, incomplete pain control, headache, blood pressure changes, nausea, vomiting, reactions to medication both or allergic, itching and postpartum back pain. Confirmed with bedside nurse the patient's most recent platelet count. Confirmed with patient that they are not currently taking any anticoagulation, have any bleeding history or any family history of bleeding disorders. Patient expressed understanding and wished to proceed. All questions were answered. Sterile technique was used throughout the entire procedure. Please see nursing notes for vital signs. Test dose was given through epidural catheter and negative prior to continuing to dose epidural or start infusion. Warning signs of high block given to the patient including shortness of breath, tingling/numbness in hands, complete motor block, or  any concerning symptoms with instructions to call for help. Patient was given instructions on fall risk and not to get out of bed. All questions and concerns addressed with instructions to call with any issues or inadequate analgesia.  Reason for block:procedure for pain

## 2019-10-30 NOTE — Anesthesia Preprocedure Evaluation (Signed)
Anesthesia Evaluation  Patient identified by MRN, date of birth, ID band Patient awake    Reviewed: Allergy & Precautions, NPO status , Patient's Chart, lab work & pertinent test results  Airway Mallampati: II  TM Distance: >3 FB Neck ROM: Full    Dental no notable dental hx.    Pulmonary neg pulmonary ROS,    Pulmonary exam normal breath sounds clear to auscultation       Cardiovascular negative cardio ROS Normal cardiovascular exam Rhythm:Regular Rate:Normal     Neuro/Psych PSYCHIATRIC DISORDERS Anxiety negative neurological ROS     GI/Hepatic negative GI ROS, Neg liver ROS,   Endo/Other  negative endocrine ROS  Renal/GU negative Renal ROS  negative genitourinary   Musculoskeletal negative musculoskeletal ROS (+)   Abdominal   Peds  Hematology negative hematology ROS (+)   Anesthesia Other Findings   Reproductive/Obstetrics (+) Pregnancy H/o CS with successful VBAC                            Anesthesia Physical Anesthesia Plan  ASA: II  Anesthesia Plan: Epidural   Post-op Pain Management:    Induction:   PONV Risk Score and Plan: Treatment may vary due to age or medical condition  Airway Management Planned: Natural Airway  Additional Equipment:   Intra-op Plan:   Post-operative Plan:   Informed Consent: I have reviewed the patients History and Physical, chart, labs and discussed the procedure including the risks, benefits and alternatives for the proposed anesthesia with the patient or authorized representative who has indicated his/her understanding and acceptance.       Plan Discussed with: Anesthesiologist  Anesthesia Plan Comments: (Patient identified. Risks, benefits, options discussed with patient including but not limited to bleeding, infection, nerve damage, paralysis, failed block, incomplete pain control, headache, blood pressure changes, nausea, vomiting,  reactions to medication, itching, and post partum back pain. Confirmed with bedside nurse the patient's most recent platelet count. Confirmed with the patient that they are not taking any anticoagulation, have any bleeding history or any family history of bleeding disorders. Patient expressed understanding and wishes to proceed. All questions were answered. )        Anesthesia Quick Evaluation

## 2019-10-30 NOTE — Lactation Note (Addendum)
This note was copied from a baby's chart. Lactation Consultation Note  Patient Name: Girl Keryn Teutsch [id number] Date: 10/30/2019 Reason for consult: Initial assessment;Term P5, 6 hour term female infant. Infant had 2 voids and 2 stools since birth. Mom is experienced in breastfeeding she breastfed 1st child for 10 months, twins for 7 months and then supplemented / breastfed them for one year and 4th child who is 6 years for 11 months. LC entered room, infant asleep in basinet. Per mom, infant just finished breastfeeding for 20 minutes [facility] room. LC did not observe latch at this time. Per mom, she has latched infant several times on demand most feedings are 20 to 30 minutes in length. Mom did mention slight pain with some latches on right breast only, LC suggested to mom to break latch when this occurs and to re-latch infant, tickle infant with breast, make sure infant's mouth is wide, bring infant chin first to breast with nose and chin touch. Mom can call RN or LC to help assist her with latching infant on her right breast and RN can give mom coconut oil for breast discomfort or soreness. LC did not assess mom's breast due to light being off and mom appeared not interested at this time in being assessed by [facility]. Mom will continue to breastfeed infant by cues, on demand, 8 -12+ times within 24 hours and understands not to go past 3 hours without breastfeeding infant. Reviewed Baby & Me book's Breastfeeding Basics.  Mom made aware of O/P services, breastfeeding support groups, community resources, and our phone # for post-discharge questions.   Maternal Data Formula Feeding for Exclusion: No Has patient been taught Hand Expression?: Yes Does the patient have breastfeeding experience prior to this delivery?: Yes  Feeding Feeding Type: Breast Fed  LATCH Score Latch: Repeated attempts needed to sustain latch, nipple held in mouth throughout feeding, stimulation needed to  elicit sucking reflex.  Audible Swallowing: Spontaneous and intermittent  Type of Nipple: Everted at rest and after stimulation  Comfort (Breast/Nipple): Soft / non-tender  Hold (Positioning): Assistance needed to correctly position infant at breast and maintain latch.  LATCH Score: 8  Interventions Interventions: Skin to skin;Hand express;Breast feeding basics reviewed;Position options;Coconut oil  Lactation Tools Discussed/Used     Consult Status Consult Status: Follow-up Date: 10/31/19 Follow-up type: In-patient    Vicente Serene 10/30/2019, 10:18 PM

## 2019-10-30 NOTE — H&P (Signed)
Joyce Bauer is a 39 y.o. female presenting for IOL. OB History    Gravida  6   Para  3   Term  2   Preterm  1   AB  2   Living  4     SAB  2   TAB      Ectopic      Multiple  1   Live Births  4          Past Medical History:  Diagnosis Date  . ADHD   . Anemia   . Anxiety   . Postpartum care following vaginal delivery 02/06/2013  . Preterm labor   . PVC's (premature ventricular contractions)   . VBAC, delivered, current hospitalization 02/06/2013   Past Surgical History:  Procedure Laterality Date  . CESAREAN SECTION  06/23/2011   Procedure: CESAREAN SECTION;  Surgeon: Marylynn Pearson;  Location: [facility];  Service: Gynecology;  Laterality: N/A;  . DILATION AND CURETTAGE OF UTERUS    . WISDOM TOOTH EXTRACTION     Family History: family history includes Alcohol abuse in her mother; Depression in her maternal uncle, mother, and paternal aunt; Diabetes in her father; Heart attack in her father; Heart disease in her father; Hyperlipidemia in her father; Hypertension in her father; Kidney disease in her maternal uncle; Mental illness in her maternal grandmother, maternal uncle, and mother; Sudden death in her paternal grandfather. Social History:  reports that she has never smoked. She has never used smokeless tobacco. She reports that she does not drink alcohol or use drugs.     Maternal Diabetes: No Genetic Screening: Normal Maternal Ultrasounds/Referrals: Normal Fetal Ultrasounds or other Referrals:  None Maternal Substance Abuse:  No Significant Maternal Medications:  None Significant Maternal Lab Results:  Group B Strep negative Other Comments:  None  Review of Systems  Constitutional: Negative.   All other systems reviewed and are negative.  Maternal Medical History:  Contractions: Onset was less than 1 hour ago.   Frequency: rare.    Fetal activity: Perceived fetal activity is normal.   Last perceived fetal movement was within the past hour.     Prenatal complications: no prenatal complications Prenatal Complications - Diabetes: none.      unknown if currently breastfeeding. Maternal Exam:  Uterine Assessment: Contraction strength is mild.  Contraction frequency is rare.   Abdomen: Patient reports no abdominal tenderness. Surgical scars: low transverse.   Fetal presentation: vertex  Introitus: Normal vulva. Normal vagina.  Ferning test: not done.  Nitrazine test: not done. Amniotic fluid character: not assessed.  Pelvis: adequate for delivery.   Cervix: Cervix evaluated by digital exam.     Physical Exam  Nursing note and vitals reviewed. Constitutional: She is oriented to person, place, and time. She appears well-developed and well-nourished.  HENT:  Head: Normocephalic and atraumatic.  Cardiovascular: Normal rate and regular rhythm.  Respiratory: Effort normal and breath sounds normal.  GI: Soft. Bowel sounds are normal.  Genitourinary:    Vulva, vagina and uterus normal.   Musculoskeletal:        General: Normal range of motion.     Cervical back: Normal range of motion and neck supple.  Neurological: She is alert and oriented to person, place, and time. She has normal reflexes.  Skin: Skin is warm and dry.  Psychiatric: She has a normal mood and affect.    Prenatal labs: ABO, Rh: --/--/O POS, O POS Performed at [facility], [place] [address],  [place], [place] [address]  [phone]) Antibody: NEG (01/31 0033) Rubella: Immune (10/06 0000) RPR: Nonreactive (10/06 0000)  HBsAg: Negative (10/06 0000)  HIV: Non-reactive (10/06 0000)  GBS: Negative/-- (04/08 0000)   Assessment/Plan: 39wk IUP History of csection with successful VBAC AMA IOL   Merriam Brandner J 10/30/2019, 8:14 AM

## 2019-10-31 LAB — CBC
HCT: 34.9 % — ABNORMAL LOW (ref 36.0–46.0)
Hemoglobin: 11.3 g/dL — ABNORMAL LOW (ref 12.0–15.0)
MCH: 30.4 pg (ref 26.0–34.0)
MCHC: 32.4 g/dL (ref 30.0–36.0)
MCV: 93.8 fL (ref 80.0–100.0)
Platelets: 161 10*3/uL (ref 150–400)
RBC: 3.72 MIL/uL — ABNORMAL LOW (ref 3.87–5.11)
RDW: 13.1 % (ref 11.5–15.5)
WBC: 10.8 10*3/uL — ABNORMAL HIGH (ref 4.0–10.5)
nRBC: 0 % (ref 0.0–0.2)

## 2019-10-31 MED ORDER — COCONUT OIL OIL: 1.0000 "application " | TOPICAL_OIL | 0 refills | Status: AC | PRN

## 2019-10-31 MED ORDER — ACETAMINOPHEN 325 MG PO TABS: 650.0000 mg | ORAL_TABLET | ORAL | Status: AC | PRN

## 2019-10-31 MED ORDER — IBUPROFEN 600 MG PO TABS: 600.0000 mg | ORAL_TABLET | Freq: Four times a day (QID) | ORAL | 0 refills | Status: AC

## 2019-10-31 MED ORDER — BENZOCAINE-MENTHOL 20-0.5 % EX AERO: 1.0000 "application " | INHALATION_SPRAY | CUTANEOUS | Status: AC | PRN

## 2019-10-31 NOTE — Anesthesia Postprocedure Evaluation (Signed)
Anesthesia Post Note  Patient: Joyce Bauer  Procedure(s) Performed: AN AD [place]     Patient location during evaluation: Mother Baby Anesthesia Type: Epidural Level of consciousness: awake and alert Pain management: pain level controlled Vital Signs Assessment: post-procedure vital signs reviewed and stable Respiratory status: spontaneous breathing Cardiovascular status: blood pressure returned to baseline Postop Assessment: no headache, adequate PO intake, no backache, patient able to bend at knees, able to ambulate, epidural receding and no apparent nausea or vomiting Anesthetic complications: no    Last Vitals:  Vitals:   10/31/19 0300 10/31/19 0532  BP: 112/77 105/73  Pulse: 60 (!) 59  Resp: 18 18  Temp: 36.7 C 36.7 C  SpO2: 99% 99%    Last Pain:  Vitals:   10/31/19 0820  TempSrc:   PainSc: Asleep   Pain Goal:                   Ailene Ards

## 2019-10-31 NOTE — Lactation Note (Signed)
This note was copied from a baby's chart. Lactation Consultation Note  Patient Name: Joyce Bauer [id number] Date: 10/31/2019 Reason for consult: Follow-up assessment;Nipple pain/trauma;Infant weight loss;Term Randel Books is 43 hours old , experienced breastfeeding mother,  Per mom sore nipples and showed the LC , right appeared to have a small abrasion and  The left  Had a intact tiny blood blister. LC noted when mom compressed the areolas  Some areola edema . LC instructed on the reverse pressure technique prior to latching on the  Right breast in the football position. Also instructed mom on the use of breast compressions  With latch until swallows and per mom more comfortable.  Baby still feeding as Dr. Corinna Capra entered and LC stepped out to get comfort gels and breast shells.  Baby fed for 20 mins on the right and after exam mom latched on the left in cross cradle and fed 15 mins . Per mom latches were comfortable.  LC provided and instructed mom on the use comfort gels after feedings x 6 days and alternating with breast shells and a dab of coconut oil.  Sore nipple and engorgement prevention and tx reviewed. Mom has a [place] with #24 F 's and 2 #27 F provided for when the milk comes in.  LC discussed nutritive vs non - nutritive feeding patterns and the importance of watching the baby for hanging out latched.  LC stressed the importance of STS feedings until the baby is back to birth weight, gaining steadily and can stay awake for majority of the feeding.  Mom has the [facility] pamphlet with [facility] phone numbers.   Maternal Data Has patient been taught Hand Expression?: Yes(mom demo back / [place] instructed mom /  reverse pressure ) Does the patient have breastfeeding experience prior to this delivery?: Yes  Feeding Feeding Type: Breast Fed  LATCH Score Latch: Grasps breast easily, tongue down, lips flanged, rhythmical sucking.  Audible Swallowing: Spontaneous and intermittent  Type of Nipple:  Everted at rest and after stimulation  Comfort (Breast/Nipple): Filling, red/small blisters or bruises, mild/mod discomfort  Hold (Positioning): Assistance needed to correctly position infant at breast and maintain latch.  LATCH Score: 8  Interventions Interventions: Breast feeding basics reviewed;Assisted with latch;Skin to skin;Breast massage;Reverse pressure;Breast compression;Adjust position;Support pillows;Position options;Expressed milk;Coconut oil;Shells;Comfort gels  Lactation Tools Discussed/Used Tools: Shells;Coconut oil;Comfort gels WIC Program: No   Consult Status Consult Status: Complete Date: 10/31/19 Follow-up type: In-patient    [place] 10/31/2019, 11:47 AM

## 2019-10-31 NOTE — Discharge Summary (Signed)
OB Discharge Summary  Patient Name: Joyce Bauer DOB: Feb 28, 1981 MRN: [id number]  Date of admission: 10/30/2019 Delivering provider: Brien Few   Admitting diagnosis: Encounter for induction of labor [Z34.90] Intrauterine pregnancy: [redacted]w[redacted]d     Secondary diagnosis: Patient Active Problem List   Diagnosis Date Noted  . Encounter for planned induction of labor 10/30/2019  . Postpartum care following VBAC 5/4 02/06/2013  . VBAC, delivered 02/06/2013   Additional problems:none   Date of discharge: 10/31/2019   Discharge diagnosis: Principal Problem:   Postpartum care following VBAC 5/4 Active Problems:   VBAC, delivered   Encounter for planned induction of labor                                                              Post partum procedures:none  Augmentation: AROM and Pitocin Pain control: Epidural  Laceration:None  Episiotomy:None  Complications: None  Hospital course:  Induction of Labor With Vaginal Delivery   39 y.o. yo [id number] at [redacted]w[redacted]d was admitted to the hospital 10/30/2019 for induction of labor.  Indication for induction: Favorable cervix at term.  Patient had an uncomplicated labor course as follows: Membrane Rupture Time/Date: 12:27 PM ,10/30/2019   Intrapartum Procedures: Episiotomy: None [1]                                         Lacerations:  None [1]  Patient had delivery of a Viable infant.  Information for the patient's newborn:  Eleyah, Hanel [id number]  Delivery Method: VBAC, Spontaneous(Filed from Delivery Summary)    10/30/2019  Details of delivery can be found in separate delivery note.  Patient had a routine postpartum course. Patient is discharged home 10/31/19.  Physical exam  Vitals:   10/30/19 1847 10/30/19 2250 10/31/19 0300 10/31/19 0532  BP: 122/75 105/65 112/77 105/73  Pulse: 73 (!) 59 60 (!) 59  Resp: 18 18 18 18   Temp: 98.3 F (36.8 C) 98 F (36.7 C) 98 F (36.7 C) 98 F (36.7 C)  TempSrc: Axillary Oral Oral Oral  SpO2:   98% 99% 99%  Weight:      Height:       General: alert, cooperative and no distress Lochia: appropriate Uterine Fundus: firm Perineum: intact DVT Evaluation: No cords or calf tenderness. No significant calf/ankle edema. Labs: Lab Results  Component Value Date   WBC 10.8 (H) 10/31/2019   HGB 11.3 (L) 10/31/2019   HCT 34.9 (L) 10/31/2019   MCV 93.8 10/31/2019   PLT 161 10/31/2019   CMP Latest Ref Rng & Units 07/29/2019  Glucose 70 - 99 mg/dL 94  BUN 6 - 20 mg/dL <5(L)  Creatinine 0.44 - 1.00 mg/dL 0.42(L)  Sodium 135 - 145 mmol/L 137  Potassium 3.5 - 5.1 mmol/L 3.7  Chloride 98 - 111 mmol/L 107  CO2 22 - 32 mmol/L 21(L)  Calcium 8.9 - 10.3 mg/dL 8.5(L)  Total Protein 6.5 - 8.1 g/dL 5.6(L)  Total Bilirubin 0.3 - 1.2 mg/dL 0.2(L)  Alkaline Phos 38 - 126 U/L 52  AST 15 - 41 U/L 18  ALT 0 - 44 U/L 15   Edinburgh Postnatal Depression Scale Screening Tool 10/30/2019 10/30/2019  I have been  able to laugh and see the funny side of things. (No Data) (No Data)   Vaccines: TDaP UTD         Flu    UTD  Discharge instruction:  per After Visit Summary,  [facility] booklet and  "Understanding Mother [facility]" hospital booklet  After Visit Meds:  Allergies as of 10/31/2019   No Known Allergies     Medication List    STOP taking these medications   aspirin EC 81 MG tablet   metoprolol tartrate 25 MG tablet Commonly known as: LOPRESSOR     TAKE these medications   acetaminophen 325 MG tablet Commonly known as: Tylenol Take 2 tablets (650 mg total) by mouth every 4 (four) hours as needed (for pain scale < 4).   benzocaine-Menthol 20-0.5 % Aero Commonly known as: DERMOPLAST Apply 1 application topically as needed for irritation (perineal discomfort).   coconut oil Oil Apply 1 application topically as needed.   doxylamine (Sleep) 25 MG tablet Commonly known as: UNISOM Take 25 mg by mouth at bedtime as needed for sleep.   ibuprofen 600 MG tablet Commonly known as:  ADVIL Take 1 tablet (600 mg total) by mouth every 6 (six) hours.   prenatal multivitamin Tabs tablet Take 1 tablet by mouth daily.            Discharge Care Instructions  (From admission, onward)         Start     Ordered   10/31/19 0000  Discharge wound care:    Comments: Sitz baths 2 times /day with warm water x 1 week. May add herbals: 1 ounce dried comfrey leaf* 1 ounce calendula flowers 1 ounce lavender flowers 1/2 ounce dried uva ursi leaves 1/2 ounce witch hazel blossoms (if you can find them) 1/2 ounce dried sage leaf 1/2 cup sea salt Directions: Bring 2 quarts of water to a boil. Turn off heat, and place 1 ounce (approximately 1 large handful) of the above mixed herbs (not the salt) into the pot. Steep, covered, for 30 minutes.  Strain the liquid well with a fine mesh strainer, and discard the herb material. Add 2 quarts of liquid to the tub, along with the 1/2 cup of salt. This medicinal liquid can also be made into compresses and peri-rinses.   10/31/19 1148          Diet: routine diet  Activity: Advance as tolerated. Pelvic rest for 6 weeks.   Postpartum contraception: Not Discussed  Newborn Data: Live born female  Birth Weight: 7 lb 14.8 oz (3595 g) APGAR: 66, 9  Newborn Delivery   Birth date/time: 10/30/2019 15:45:00 Delivery type: VBAC, Spontaneous      named Marnette Burgess Baby Feeding: Breast Disposition:home with mother   Delivery Report:  Review the Delivery Report for details.    Follow up: Follow-up Information    Brien Few, MD. Schedule an appointment as soon as possible for a visit in 6 week(s).   Specialty: Obstetrics and Gynecology Contact information: [address] [place] [address] [phone]             Signed: Otilio Carpen, MSN 10/31/2019, 11:49 AM

## 2019-10-31 NOTE — Progress Notes (Signed)
CSW received consult for hx of Anxiety. CSW met with [name] to offer support and complete assessment.    CSW congratulated [name] on the birth of infant Joyce Bauer). CSW advised [name] of CSW's role and the reason for CSW coming to visit with her. [name] reports that she was never clinically diagnosed with anxiety but once COVID began, anxiety signs and symptoms were present at times. [name] reports that was placed on medications in the past (Lexapro) but reports no current medications for mental health at this time. MON verbalized to CSW that at the age of 34 she was in therapy but expressed no current therapy needs at this time. [name] reported that she isn't feeling SI or HI and denies DV also.   [name] reported that her supports include spouse, mob, dad, and other family members. [name] expressed that she has all needed items to care for infant with no other needs at the moment.   CSW provided education regarding the baby blues period vs. perinatal mood disorders, discussed treatment and gave resources for mental health follow up if concerns arise.  CSW recommends self-evaluation during the postpartum time period using the New Mom Checklist from Postpartum Progress and encouraged [name] to contact a medical professional if symptoms are noted at any time.   CSW provided review of Sudden Infant Death Syndrome (SIDS) precautions.     CSW identifies no further need for intervention and no barriers to discharge at this time.    Joyce Bauer, MSW, LCSW [facility] [place] [facility] [phone]

## 2020-01-22 ENCOUNTER — Other Ambulatory Visit: Payer: Self-pay | Admitting: Registered Nurse

## 2020-01-22 DIAGNOSIS — Z1231 Encounter for screening mammogram for malignant neoplasm of breast: Secondary | ICD-10-CM

## 2020-02-07 ENCOUNTER — Other Ambulatory Visit: Payer: Self-pay

## 2020-02-07 ENCOUNTER — Ambulatory Visit
Admission: RE | Admit: 2020-02-07 | Discharge: 2020-02-07 | Disposition: A | Discharge status: Home / Self Care | Payer: 59 | Source: Ambulatory Visit | Attending: Registered Nurse | Admitting: Registered Nurse

## 2020-02-07 DIAGNOSIS — Z1231 Encounter for screening mammogram for malignant neoplasm of breast: Secondary | ICD-10-CM

## 2020-02-15 ENCOUNTER — Other Ambulatory Visit: Payer: Self-pay

## 2020-05-19 DIAGNOSIS — Z0000 Encounter for general adult medical examination without abnormal findings: Secondary | ICD-10-CM | POA: Diagnosis not present

## 2020-05-19 DIAGNOSIS — E559 Vitamin D deficiency, unspecified: Secondary | ICD-10-CM | POA: Diagnosis not present

## 2020-05-19 DIAGNOSIS — F419 Anxiety disorder, unspecified: Secondary | ICD-10-CM | POA: Diagnosis not present

## 2020-06-30 DIAGNOSIS — M79645 Pain in left finger(s): Secondary | ICD-10-CM | POA: Diagnosis not present

## 2020-06-30 DIAGNOSIS — M25562 Pain in left knee: Secondary | ICD-10-CM | POA: Diagnosis not present

## 2020-09-30 DIAGNOSIS — M1712 Unilateral primary osteoarthritis, left knee: Secondary | ICD-10-CM | POA: Diagnosis not present

## 2020-10-14 DIAGNOSIS — M25562 Pain in left knee: Secondary | ICD-10-CM | POA: Diagnosis not present

## 2020-12-24 ENCOUNTER — Other Ambulatory Visit: Payer: Self-pay | Admitting: Registered Nurse

## 2020-12-24 DIAGNOSIS — Z1231 Encounter for screening mammogram for malignant neoplasm of breast: Secondary | ICD-10-CM

## 2021-01-20 DIAGNOSIS — M79671 Pain in right foot: Secondary | ICD-10-CM | POA: Diagnosis not present

## 2021-02-17 ENCOUNTER — Ambulatory Visit
Admission: RE | Admit: 2021-02-17 | Discharge: 2021-02-17 | Disposition: A | Discharge status: Home / Self Care | Payer: 59 | Source: Ambulatory Visit | Attending: Registered Nurse | Admitting: Registered Nurse

## 2021-02-17 ENCOUNTER — Other Ambulatory Visit: Payer: Self-pay

## 2021-02-17 DIAGNOSIS — Z1231 Encounter for screening mammogram for malignant neoplasm of breast: Secondary | ICD-10-CM | POA: Diagnosis not present

## 2021-03-23 DIAGNOSIS — L821 Other seborrheic keratosis: Secondary | ICD-10-CM | POA: Diagnosis not present

## 2021-03-23 DIAGNOSIS — D2271 Melanocytic nevi of right lower limb, including hip: Secondary | ICD-10-CM | POA: Diagnosis not present

## 2021-03-23 DIAGNOSIS — D692 Other nonthrombocytopenic purpura: Secondary | ICD-10-CM | POA: Diagnosis not present

## 2021-03-23 DIAGNOSIS — L573 Poikiloderma of Civatte: Secondary | ICD-10-CM | POA: Diagnosis not present

## 2021-03-23 DIAGNOSIS — L738 Other specified follicular disorders: Secondary | ICD-10-CM | POA: Diagnosis not present

## 2021-03-23 DIAGNOSIS — I788 Other diseases of capillaries: Secondary | ICD-10-CM | POA: Diagnosis not present

## 2021-03-23 DIAGNOSIS — D2272 Melanocytic nevi of left lower limb, including hip: Secondary | ICD-10-CM | POA: Diagnosis not present

## 2021-03-23 DIAGNOSIS — D2239 Melanocytic nevi of other parts of face: Secondary | ICD-10-CM | POA: Diagnosis not present

## 2021-03-23 DIAGNOSIS — D220 Melanocytic nevi of lip: Secondary | ICD-10-CM | POA: Diagnosis not present

## 2021-04-13 ENCOUNTER — Other Ambulatory Visit (HOSPITAL_COMMUNITY): Payer: Self-pay

## 2021-04-13 MED ORDER — ESCITALOPRAM OXALATE 10 MG PO TABS
10.0000 mg | ORAL_TABLET | Freq: Every day | ORAL | 1 refills | Status: DC
  Filled 2021-04-13 – 2021-05-13 (×2): qty 90, 90d supply, fill #0
  Filled 2021-08-07: qty 90, 90d supply, fill #1

## 2021-04-21 ENCOUNTER — Other Ambulatory Visit (HOSPITAL_COMMUNITY): Payer: Self-pay

## 2021-04-22 DIAGNOSIS — M25571 Pain in right ankle and joints of right foot: Secondary | ICD-10-CM | POA: Diagnosis not present

## 2021-04-28 ENCOUNTER — Other Ambulatory Visit (HOSPITAL_COMMUNITY): Payer: Self-pay

## 2021-04-28 MED ORDER — MOUNJARO 7.5 MG/0.5ML ~~LOC~~ SOAJ
7.5000 mg | SUBCUTANEOUS | 0 refills | Status: DC
  Filled 2021-05-13: qty 2, 28d supply, fill #0

## 2021-04-28 MED ORDER — MOUNJARO 10 MG/0.5ML ~~LOC~~ SOAJ
10.0000 mg | SUBCUTANEOUS | 0 refills | Status: DC
  Filled 2021-05-29: qty 2, 28d supply, fill #0

## 2021-05-13 ENCOUNTER — Other Ambulatory Visit (HOSPITAL_COMMUNITY): Payer: Self-pay

## 2021-05-25 DIAGNOSIS — I498 Other specified cardiac arrhythmias: Secondary | ICD-10-CM | POA: Diagnosis not present

## 2021-05-25 DIAGNOSIS — E663 Overweight: Secondary | ICD-10-CM | POA: Diagnosis not present

## 2021-05-25 DIAGNOSIS — E559 Vitamin D deficiency, unspecified: Secondary | ICD-10-CM | POA: Diagnosis not present

## 2021-05-25 DIAGNOSIS — Z0000 Encounter for general adult medical examination without abnormal findings: Secondary | ICD-10-CM | POA: Diagnosis not present

## 2021-05-27 ENCOUNTER — Other Ambulatory Visit (HOSPITAL_COMMUNITY): Payer: Self-pay

## 2021-05-27 MED ORDER — ZOLPIDEM TARTRATE 5 MG PO TABS
5.0000 mg | ORAL_TABLET | Freq: Every day | ORAL | 1 refills | Status: DC
  Filled 2021-05-27: qty 90, 90d supply, fill #0
  Filled 2021-08-31: qty 90, 90d supply, fill #1

## 2021-05-29 ENCOUNTER — Other Ambulatory Visit (HOSPITAL_COMMUNITY): Payer: Self-pay

## 2021-06-26 DIAGNOSIS — M25571 Pain in right ankle and joints of right foot: Secondary | ICD-10-CM | POA: Diagnosis not present

## 2021-07-01 ENCOUNTER — Other Ambulatory Visit (HOSPITAL_COMMUNITY): Payer: Self-pay

## 2021-07-01 MED ORDER — MOUNJARO 10 MG/0.5ML ~~LOC~~ SOAJ
10.0000 mg | SUBCUTANEOUS | 5 refills | Status: AC
  Filled 2021-07-01: qty 2, 28d supply, fill #0

## 2021-07-02 ENCOUNTER — Other Ambulatory Visit (HOSPITAL_COMMUNITY): Payer: Self-pay

## 2021-07-27 DIAGNOSIS — M79671 Pain in right foot: Secondary | ICD-10-CM | POA: Diagnosis not present

## 2021-08-04 ENCOUNTER — Other Ambulatory Visit (HOSPITAL_COMMUNITY): Payer: Self-pay

## 2021-08-04 MED ORDER — MOUNJARO 10 MG/0.5ML ~~LOC~~ SOAJ
SUBCUTANEOUS | 5 refills | Status: AC
  Filled 2021-08-04: qty 2, 28d supply, fill #0

## 2021-08-06 ENCOUNTER — Other Ambulatory Visit (HOSPITAL_COMMUNITY): Payer: Self-pay

## 2021-08-06 MED ORDER — MOUNJARO 7.5 MG/0.5ML ~~LOC~~ SOAJ
7.5000 mg | SUBCUTANEOUS | 5 refills | Status: AC
  Filled 2021-08-06: qty 2, 28d supply, fill #0
  Filled 2021-08-31: qty 2, 28d supply, fill #1

## 2021-08-07 ENCOUNTER — Other Ambulatory Visit (HOSPITAL_COMMUNITY): Payer: Self-pay

## 2021-08-17 ENCOUNTER — Other Ambulatory Visit: Payer: Self-pay | Admitting: Orthopaedic Surgery

## 2021-08-17 DIAGNOSIS — M79671 Pain in right foot: Secondary | ICD-10-CM

## 2021-08-31 ENCOUNTER — Other Ambulatory Visit (HOSPITAL_COMMUNITY): Payer: Self-pay

## 2021-09-09 ENCOUNTER — Other Ambulatory Visit (HOSPITAL_COMMUNITY): Payer: Self-pay

## 2021-09-09 MED ORDER — MOUNJARO 10 MG/0.5ML ~~LOC~~ SOAJ
10.0000 mg | SUBCUTANEOUS | 5 refills | Status: AC
  Filled 2021-09-09 (×2): qty 6, 84d supply, fill #0

## 2021-09-11 ENCOUNTER — Ambulatory Visit
Admission: RE | Admit: 2021-09-11 | Discharge: 2021-09-11 | Disposition: A | Discharge status: Home / Self Care | Payer: 59 | Source: Ambulatory Visit | Attending: Orthopaedic Surgery | Admitting: Orthopaedic Surgery

## 2021-09-11 ENCOUNTER — Other Ambulatory Visit: Payer: Self-pay

## 2021-09-11 DIAGNOSIS — M79671 Pain in right foot: Secondary | ICD-10-CM

## 2021-09-11 DIAGNOSIS — M19072 Primary osteoarthritis, left ankle and foot: Secondary | ICD-10-CM | POA: Diagnosis not present

## 2021-10-20 ENCOUNTER — Other Ambulatory Visit (HOSPITAL_COMMUNITY): Payer: Self-pay

## 2021-11-26 ENCOUNTER — Other Ambulatory Visit (HOSPITAL_COMMUNITY): Payer: Self-pay

## 2021-11-27 ENCOUNTER — Other Ambulatory Visit (HOSPITAL_COMMUNITY): Payer: Self-pay

## 2021-11-27 MED ORDER — ESCITALOPRAM OXALATE 10 MG PO TABS
10.0000 mg | ORAL_TABLET | Freq: Every day | ORAL | 1 refills | Status: AC
  Filled 2021-11-27 – 2021-12-17 (×2): qty 90, 90d supply, fill #0

## 2021-12-07 ENCOUNTER — Other Ambulatory Visit (HOSPITAL_COMMUNITY): Payer: Self-pay

## 2021-12-17 ENCOUNTER — Other Ambulatory Visit (HOSPITAL_COMMUNITY): Payer: Self-pay

## 2021-12-23 ENCOUNTER — Other Ambulatory Visit (HOSPITAL_COMMUNITY): Payer: Self-pay

## 2021-12-23 DIAGNOSIS — E6609 Other obesity due to excess calories: Secondary | ICD-10-CM | POA: Diagnosis not present

## 2021-12-23 DIAGNOSIS — Z8249 Family history of ischemic heart disease and other diseases of the circulatory system: Secondary | ICD-10-CM | POA: Diagnosis not present

## 2021-12-23 DIAGNOSIS — Z6832 Body mass index (BMI) 32.0-32.9, adult: Secondary | ICD-10-CM | POA: Diagnosis not present

## 2021-12-23 MED ORDER — WEGOVY 1.7 MG/0.75ML ~~LOC~~ SOAJ
1.7000 mg | SUBCUTANEOUS | 4 refills | Status: DC
  Filled 2021-12-23 – 2021-12-30 (×2): qty 3, 28d supply, fill #0
  Filled 2022-01-26: qty 3, 28d supply, fill #1
  Filled 2022-03-04: qty 3, 28d supply, fill #2
  Filled 2022-03-27: qty 3, 28d supply, fill #3
  Filled 2022-04-26: qty 3, 28d supply, fill #4

## 2021-12-23 MED ORDER — ONDANSETRON HCL 4 MG PO TABS
4.0000 mg | ORAL_TABLET | Freq: Every day | ORAL | 0 refills | Status: AC | PRN
  Filled 2021-12-23: qty 10, 10d supply, fill #0

## 2021-12-23 MED ORDER — WEGOVY 1 MG/0.5ML ~~LOC~~ SOAJ
1.0000 mg | SUBCUTANEOUS | 0 refills | Status: AC
  Filled 2021-12-23 – 2021-12-31 (×2): qty 2, 28d supply, fill #0

## 2021-12-28 ENCOUNTER — Other Ambulatory Visit (HOSPITAL_COMMUNITY): Payer: Self-pay

## 2021-12-30 ENCOUNTER — Other Ambulatory Visit (HOSPITAL_COMMUNITY): Payer: Self-pay

## 2021-12-31 ENCOUNTER — Other Ambulatory Visit (HOSPITAL_COMMUNITY): Payer: Self-pay

## 2022-01-04 ENCOUNTER — Other Ambulatory Visit: Payer: Self-pay | Admitting: Registered Nurse

## 2022-01-04 DIAGNOSIS — Z1231 Encounter for screening mammogram for malignant neoplasm of breast: Secondary | ICD-10-CM

## 2022-01-26 ENCOUNTER — Other Ambulatory Visit (HOSPITAL_COMMUNITY): Payer: Self-pay

## 2022-01-26 MED ORDER — ZOLPIDEM TARTRATE 5 MG PO TABS
5.0000 mg | ORAL_TABLET | Freq: Every day | ORAL | 1 refills | Status: DC
  Filled 2022-01-26 – 2022-03-04 (×2): qty 90, 90d supply, fill #0
  Filled 2022-06-27 – 2022-06-30 (×4): qty 90, 90d supply, fill #1

## 2022-02-03 ENCOUNTER — Other Ambulatory Visit (HOSPITAL_COMMUNITY): Payer: Self-pay

## 2022-02-18 ENCOUNTER — Ambulatory Visit: Payer: 59

## 2022-03-04 ENCOUNTER — Other Ambulatory Visit (HOSPITAL_COMMUNITY): Payer: Self-pay

## 2022-03-11 ENCOUNTER — Ambulatory Visit
Admission: RE | Admit: 2022-03-11 | Discharge: 2022-03-11 | Disposition: A | Discharge status: Home / Self Care | Payer: 59 | Source: Ambulatory Visit | Attending: Registered Nurse | Admitting: Registered Nurse

## 2022-03-11 DIAGNOSIS — Z1231 Encounter for screening mammogram for malignant neoplasm of breast: Secondary | ICD-10-CM | POA: Diagnosis not present

## 2022-03-18 ENCOUNTER — Other Ambulatory Visit (HOSPITAL_COMMUNITY): Payer: Self-pay

## 2022-03-18 DIAGNOSIS — F411 Generalized anxiety disorder: Secondary | ICD-10-CM | POA: Diagnosis not present

## 2022-03-18 MED ORDER — ESCITALOPRAM OXALATE 20 MG PO TABS
20.0000 mg | ORAL_TABLET | Freq: Every day | ORAL | 0 refills | Status: DC
  Filled 2022-03-18: qty 30, 30d supply, fill #0

## 2022-03-18 MED ORDER — CLONAZEPAM 0.5 MG PO TABS
0.5000 mg | ORAL_TABLET | Freq: Two times a day (BID) | ORAL | 0 refills | Status: DC | PRN
  Filled 2022-03-18: qty 60, 30d supply, fill #0

## 2022-03-29 ENCOUNTER — Other Ambulatory Visit (HOSPITAL_COMMUNITY): Payer: Self-pay

## 2022-03-31 DIAGNOSIS — L814 Other melanin hyperpigmentation: Secondary | ICD-10-CM | POA: Diagnosis not present

## 2022-03-31 DIAGNOSIS — D2262 Melanocytic nevi of left upper limb, including shoulder: Secondary | ICD-10-CM | POA: Diagnosis not present

## 2022-03-31 DIAGNOSIS — D225 Melanocytic nevi of trunk: Secondary | ICD-10-CM | POA: Diagnosis not present

## 2022-03-31 DIAGNOSIS — I788 Other diseases of capillaries: Secondary | ICD-10-CM | POA: Diagnosis not present

## 2022-03-31 DIAGNOSIS — D2239 Melanocytic nevi of other parts of face: Secondary | ICD-10-CM | POA: Diagnosis not present

## 2022-03-31 DIAGNOSIS — D2271 Melanocytic nevi of right lower limb, including hip: Secondary | ICD-10-CM | POA: Diagnosis not present

## 2022-03-31 DIAGNOSIS — L573 Poikiloderma of Civatte: Secondary | ICD-10-CM | POA: Diagnosis not present

## 2022-03-31 DIAGNOSIS — L821 Other seborrheic keratosis: Secondary | ICD-10-CM | POA: Diagnosis not present

## 2022-03-31 DIAGNOSIS — D1801 Hemangioma of skin and subcutaneous tissue: Secondary | ICD-10-CM | POA: Diagnosis not present

## 2022-04-10 ENCOUNTER — Other Ambulatory Visit (HOSPITAL_COMMUNITY): Payer: Self-pay

## 2022-04-12 ENCOUNTER — Other Ambulatory Visit (HOSPITAL_COMMUNITY): Payer: Self-pay

## 2022-04-12 MED ORDER — ESCITALOPRAM OXALATE 20 MG PO TABS
20.0000 mg | ORAL_TABLET | Freq: Every day | ORAL | 0 refills | Status: DC
  Filled 2022-04-12: qty 30, 30d supply, fill #0

## 2022-04-15 ENCOUNTER — Other Ambulatory Visit (HOSPITAL_COMMUNITY): Payer: Self-pay

## 2022-04-26 ENCOUNTER — Other Ambulatory Visit (HOSPITAL_COMMUNITY): Payer: Self-pay

## 2022-05-12 ENCOUNTER — Other Ambulatory Visit (HOSPITAL_COMMUNITY): Payer: Self-pay

## 2022-05-12 MED ORDER — ESCITALOPRAM OXALATE 20 MG PO TABS
20.0000 mg | ORAL_TABLET | Freq: Every day | ORAL | 0 refills | Status: DC
  Filled 2022-05-12 – 2022-05-26 (×2): qty 30, 30d supply, fill #0

## 2022-05-13 ENCOUNTER — Other Ambulatory Visit (HOSPITAL_COMMUNITY): Payer: Self-pay

## 2022-05-21 ENCOUNTER — Other Ambulatory Visit (HOSPITAL_COMMUNITY): Payer: Self-pay

## 2022-05-26 ENCOUNTER — Other Ambulatory Visit (HOSPITAL_COMMUNITY): Payer: Self-pay

## 2022-05-27 DIAGNOSIS — F419 Anxiety disorder, unspecified: Secondary | ICD-10-CM | POA: Diagnosis not present

## 2022-05-27 DIAGNOSIS — E559 Vitamin D deficiency, unspecified: Secondary | ICD-10-CM | POA: Diagnosis not present

## 2022-05-27 DIAGNOSIS — Z0000 Encounter for general adult medical examination without abnormal findings: Secondary | ICD-10-CM | POA: Diagnosis not present

## 2022-05-28 DIAGNOSIS — R3121 Asymptomatic microscopic hematuria: Secondary | ICD-10-CM | POA: Diagnosis not present

## 2022-05-31 ENCOUNTER — Other Ambulatory Visit: Payer: Self-pay | Admitting: Internal Medicine

## 2022-05-31 DIAGNOSIS — R3121 Asymptomatic microscopic hematuria: Secondary | ICD-10-CM

## 2022-06-04 ENCOUNTER — Other Ambulatory Visit (HOSPITAL_COMMUNITY): Payer: Self-pay

## 2022-06-04 MED ORDER — WEGOVY 1.7 MG/0.75ML ~~LOC~~ SOAJ
1.7000 mg | SUBCUTANEOUS | 4 refills | Status: AC
  Filled 2022-06-04: qty 3, 28d supply, fill #0
  Filled 2022-06-27 – 2022-08-11 (×2): qty 3, 28d supply, fill #1

## 2022-06-11 ENCOUNTER — Ambulatory Visit
Admission: RE | Admit: 2022-06-11 | Discharge: 2022-06-11 | Disposition: A | Discharge status: Home / Self Care | Payer: 59 | Source: Ambulatory Visit | Attending: Internal Medicine | Admitting: Internal Medicine

## 2022-06-11 DIAGNOSIS — R3121 Asymptomatic microscopic hematuria: Secondary | ICD-10-CM

## 2022-06-11 DIAGNOSIS — R319 Hematuria, unspecified: Secondary | ICD-10-CM | POA: Diagnosis not present

## 2022-06-27 ENCOUNTER — Other Ambulatory Visit (HOSPITAL_COMMUNITY): Payer: Self-pay

## 2022-06-29 ENCOUNTER — Other Ambulatory Visit (HOSPITAL_COMMUNITY): Payer: Self-pay

## 2022-06-29 MED ORDER — ESCITALOPRAM OXALATE 20 MG PO TABS
20.0000 mg | ORAL_TABLET | Freq: Every day | ORAL | 0 refills | Status: DC
  Filled 2022-06-29: qty 30, 30d supply, fill #0

## 2022-06-30 ENCOUNTER — Other Ambulatory Visit (HOSPITAL_COMMUNITY): Payer: Self-pay

## 2022-06-30 MED ORDER — ZEPBOUND 7.5 MG/0.5ML ~~LOC~~ SOAJ
7.5000 mg | SUBCUTANEOUS | 0 refills | Status: DC
  Filled 2022-09-21 – 2023-01-28 (×4): qty 2, 28d supply, fill #0

## 2022-06-30 MED ORDER — ZEPBOUND 5 MG/0.5ML ~~LOC~~ SOAJ
5.0000 mg | SUBCUTANEOUS | 0 refills | Status: AC
  Filled 2022-06-30 – 2022-11-09 (×2): qty 2, 28d supply, fill #0

## 2022-07-01 ENCOUNTER — Other Ambulatory Visit (HOSPITAL_COMMUNITY): Payer: Self-pay

## 2022-07-05 ENCOUNTER — Other Ambulatory Visit (HOSPITAL_COMMUNITY): Payer: Self-pay

## 2022-07-13 ENCOUNTER — Other Ambulatory Visit (HOSPITAL_COMMUNITY): Payer: Self-pay

## 2022-07-20 ENCOUNTER — Other Ambulatory Visit (HOSPITAL_COMMUNITY): Payer: Self-pay

## 2022-07-21 ENCOUNTER — Other Ambulatory Visit (HOSPITAL_COMMUNITY): Payer: Self-pay

## 2022-08-03 DIAGNOSIS — R3121 Asymptomatic microscopic hematuria: Secondary | ICD-10-CM | POA: Diagnosis not present

## 2022-08-10 DIAGNOSIS — Z01419 Encounter for gynecological examination (general) (routine) without abnormal findings: Secondary | ICD-10-CM | POA: Diagnosis not present

## 2022-08-10 DIAGNOSIS — Z124 Encounter for screening for malignant neoplasm of cervix: Secondary | ICD-10-CM | POA: Diagnosis not present

## 2022-08-10 DIAGNOSIS — Z01411 Encounter for gynecological examination (general) (routine) with abnormal findings: Secondary | ICD-10-CM | POA: Diagnosis not present

## 2022-08-10 DIAGNOSIS — Z113 Encounter for screening for infections with a predominantly sexual mode of transmission: Secondary | ICD-10-CM | POA: Diagnosis not present

## 2022-08-10 DIAGNOSIS — Z6822 Body mass index (BMI) 22.0-22.9, adult: Secondary | ICD-10-CM | POA: Diagnosis not present

## 2022-08-16 ENCOUNTER — Other Ambulatory Visit (HOSPITAL_COMMUNITY): Payer: Self-pay

## 2022-09-06 ENCOUNTER — Other Ambulatory Visit (HOSPITAL_COMMUNITY): Payer: Self-pay

## 2022-09-07 DIAGNOSIS — R3121 Asymptomatic microscopic hematuria: Secondary | ICD-10-CM | POA: Diagnosis not present

## 2022-09-07 DIAGNOSIS — R319 Hematuria, unspecified: Secondary | ICD-10-CM | POA: Diagnosis not present

## 2022-09-13 DIAGNOSIS — R3121 Asymptomatic microscopic hematuria: Secondary | ICD-10-CM | POA: Diagnosis not present

## 2022-09-21 ENCOUNTER — Other Ambulatory Visit (HOSPITAL_COMMUNITY): Payer: Self-pay

## 2022-09-22 ENCOUNTER — Other Ambulatory Visit (HOSPITAL_COMMUNITY): Payer: Self-pay

## 2022-09-22 MED ORDER — ESCITALOPRAM OXALATE 20 MG PO TABS
20.0000 mg | ORAL_TABLET | Freq: Every day | ORAL | 0 refills | Status: DC
  Filled 2022-09-22: qty 30, 30d supply, fill #0

## 2022-09-22 MED ORDER — ZOLPIDEM TARTRATE 5 MG PO TABS
5.0000 mg | ORAL_TABLET | Freq: Every evening | ORAL | 1 refills | Status: DC
  Filled 2022-09-22 – 2022-10-01 (×2): qty 90, 90d supply, fill #0

## 2022-09-23 ENCOUNTER — Other Ambulatory Visit (HOSPITAL_COMMUNITY): Payer: Self-pay

## 2022-09-23 DIAGNOSIS — E6609 Other obesity due to excess calories: Secondary | ICD-10-CM | POA: Diagnosis not present

## 2022-09-23 DIAGNOSIS — Z8249 Family history of ischemic heart disease and other diseases of the circulatory system: Secondary | ICD-10-CM | POA: Diagnosis not present

## 2022-09-23 MED ORDER — ZEPBOUND 5 MG/0.5ML ~~LOC~~ SOAJ
5.0000 mg | SUBCUTANEOUS | 3 refills | Status: AC
  Filled 2022-09-23: qty 2, 28d supply, fill #0

## 2022-09-24 ENCOUNTER — Other Ambulatory Visit (HOSPITAL_COMMUNITY): Payer: Self-pay

## 2022-09-30 ENCOUNTER — Other Ambulatory Visit (HOSPITAL_COMMUNITY): Payer: Self-pay

## 2022-10-01 ENCOUNTER — Other Ambulatory Visit (HOSPITAL_COMMUNITY): Payer: Self-pay

## 2022-10-01 ENCOUNTER — Other Ambulatory Visit: Payer: Self-pay

## 2022-10-04 ENCOUNTER — Other Ambulatory Visit (HOSPITAL_COMMUNITY): Payer: Self-pay

## 2022-10-05 ENCOUNTER — Other Ambulatory Visit (HOSPITAL_COMMUNITY): Payer: Self-pay

## 2022-10-07 DIAGNOSIS — H5213 Myopia, bilateral: Secondary | ICD-10-CM | POA: Diagnosis not present

## 2022-11-09 ENCOUNTER — Other Ambulatory Visit (HOSPITAL_COMMUNITY): Payer: Self-pay

## 2022-11-09 MED ORDER — ESCITALOPRAM OXALATE 20 MG PO TABS
20.0000 mg | ORAL_TABLET | Freq: Every day | ORAL | 0 refills | Status: DC
  Filled 2022-11-09: qty 30, 30d supply, fill #0

## 2022-11-12 ENCOUNTER — Other Ambulatory Visit (HOSPITAL_COMMUNITY): Payer: Self-pay

## 2022-11-19 ENCOUNTER — Other Ambulatory Visit (HOSPITAL_COMMUNITY): Payer: Self-pay

## 2022-12-09 ENCOUNTER — Other Ambulatory Visit (HOSPITAL_COMMUNITY): Payer: Self-pay

## 2022-12-09 MED ORDER — ZEPBOUND 7.5 MG/0.5ML ~~LOC~~ SOAJ
7.5000 mg | SUBCUTANEOUS | 1 refills | Status: DC
  Filled 2022-12-09: qty 2, 28d supply, fill #0
  Filled 2023-03-16: qty 2, 28d supply, fill #1

## 2022-12-09 MED ORDER — ESCITALOPRAM OXALATE 20 MG PO TABS
20.0000 mg | ORAL_TABLET | Freq: Every day | ORAL | 2 refills | Status: DC
  Filled 2022-12-09: qty 30, 30d supply, fill #0
  Filled 2023-01-28: qty 30, 30d supply, fill #1
  Filled 2023-03-16: qty 30, 30d supply, fill #2

## 2022-12-10 ENCOUNTER — Other Ambulatory Visit (HOSPITAL_COMMUNITY): Payer: Self-pay

## 2022-12-13 ENCOUNTER — Other Ambulatory Visit (HOSPITAL_COMMUNITY): Payer: Self-pay

## 2022-12-16 ENCOUNTER — Other Ambulatory Visit: Payer: Self-pay

## 2023-01-28 ENCOUNTER — Other Ambulatory Visit (HOSPITAL_COMMUNITY): Payer: Self-pay

## 2023-01-28 ENCOUNTER — Other Ambulatory Visit: Payer: Self-pay

## 2023-01-31 ENCOUNTER — Other Ambulatory Visit (HOSPITAL_COMMUNITY): Payer: Self-pay

## 2023-01-31 MED ORDER — CLONAZEPAM 0.5 MG PO TABS
0.5000 mg | ORAL_TABLET | Freq: Two times a day (BID) | ORAL | 0 refills | Status: DC | PRN
  Filled 2023-01-31 – 2023-03-16 (×2): qty 60, 30d supply, fill #0

## 2023-02-14 ENCOUNTER — Other Ambulatory Visit (HOSPITAL_COMMUNITY): Payer: Self-pay

## 2023-03-16 ENCOUNTER — Other Ambulatory Visit (HOSPITAL_COMMUNITY): Payer: Self-pay

## 2023-03-17 ENCOUNTER — Other Ambulatory Visit: Payer: Self-pay

## 2023-03-29 DIAGNOSIS — E6609 Other obesity due to excess calories: Secondary | ICD-10-CM | POA: Diagnosis not present

## 2023-05-02 ENCOUNTER — Other Ambulatory Visit (HOSPITAL_COMMUNITY): Payer: Self-pay

## 2023-05-02 MED ORDER — ZEPBOUND 7.5 MG/0.5ML ~~LOC~~ SOAJ
SUBCUTANEOUS | 1 refills | Status: DC
  Filled 2023-05-02 – 2023-05-03 (×2): qty 2, 28d supply, fill #0
  Filled 2023-06-13 – 2023-06-14 (×2): qty 2, 28d supply, fill #1

## 2023-05-02 MED ORDER — ESCITALOPRAM OXALATE 20 MG PO TABS
20.0000 mg | ORAL_TABLET | Freq: Every day | ORAL | 2 refills | Status: DC
  Filled 2023-05-02 – 2023-05-03 (×2): qty 30, 30d supply, fill #0
  Filled 2023-06-13: qty 30, 30d supply, fill #1
  Filled 2023-08-02: qty 30, 30d supply, fill #2

## 2023-05-03 ENCOUNTER — Other Ambulatory Visit (HOSPITAL_COMMUNITY): Payer: Self-pay

## 2023-05-03 ENCOUNTER — Other Ambulatory Visit: Payer: Self-pay

## 2023-05-03 ENCOUNTER — Other Ambulatory Visit: Payer: Self-pay | Admitting: Registered Nurse

## 2023-05-03 DIAGNOSIS — Z1231 Encounter for screening mammogram for malignant neoplasm of breast: Secondary | ICD-10-CM

## 2023-05-05 ENCOUNTER — Other Ambulatory Visit: Payer: Self-pay

## 2023-05-30 ENCOUNTER — Ambulatory Visit
Admission: RE | Admit: 2023-05-30 | Discharge: 2023-05-30 | Disposition: A | Discharge status: Home / Self Care | Payer: Commercial Managed Care - PPO | Source: Ambulatory Visit | Attending: Registered Nurse | Admitting: Registered Nurse

## 2023-05-30 DIAGNOSIS — Z1231 Encounter for screening mammogram for malignant neoplasm of breast: Secondary | ICD-10-CM | POA: Diagnosis not present

## 2023-06-02 DIAGNOSIS — Z0000 Encounter for general adult medical examination without abnormal findings: Secondary | ICD-10-CM | POA: Diagnosis not present

## 2023-06-02 DIAGNOSIS — E559 Vitamin D deficiency, unspecified: Secondary | ICD-10-CM | POA: Diagnosis not present

## 2023-06-02 DIAGNOSIS — F411 Generalized anxiety disorder: Secondary | ICD-10-CM | POA: Diagnosis not present

## 2023-06-13 ENCOUNTER — Other Ambulatory Visit: Payer: Self-pay

## 2023-06-14 ENCOUNTER — Other Ambulatory Visit: Payer: Self-pay

## 2023-06-16 ENCOUNTER — Other Ambulatory Visit: Payer: Self-pay

## 2023-06-16 MED ORDER — CLONAZEPAM 0.5 MG PO TABS
0.5000 mg | ORAL_TABLET | Freq: Two times a day (BID) | ORAL | 0 refills | Status: AC | PRN
  Filled 2023-06-16: qty 60, 30d supply, fill #0

## 2023-06-16 MED ORDER — ZOLPIDEM TARTRATE 5 MG PO TABS
5.0000 mg | ORAL_TABLET | Freq: Every day | ORAL | 1 refills | Status: DC
  Filled 2023-06-16: qty 90, 90d supply, fill #0

## 2023-06-20 ENCOUNTER — Other Ambulatory Visit: Payer: Self-pay

## 2023-06-23 ENCOUNTER — Other Ambulatory Visit: Payer: Self-pay

## 2023-07-02 IMAGING — CT CT FOOT*R* W/O CM
2 of 3 series · 13 of 29 positions shown, 16 images · non-contrast
Comparison: MR ankle 02/02/2021.

CLINICAL DATA: Right foot pain.

EXAM:
CT OF THE RIGHT FOOT WITHOUT CONTRAST
TECHNIQUE: Multidetector CT imaging of the right foot was performed according
to the standard protocol. Multiplanar CT image reconstructions were
also generated.
RADIATION DOSE REDUCTION: This exam was performed according to the
departmental dose-optimization program which includes automated
exposure control, adjustment of the mA and/or kV according to
patient size and/or use of iterative reconstruction technique.

[Series 4: soft tissue lower extremity · axial · 0.52mm/px · z∈[-1340,-1194]mm · 8 of 87 slices shown, 10 images]
[im 7/87  soft-tissue]
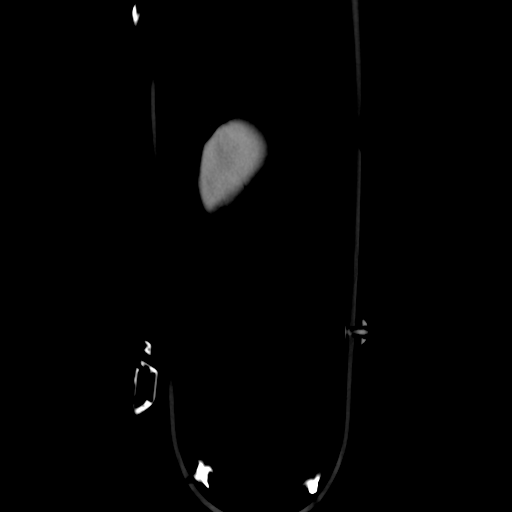
[im 7/87  bone]
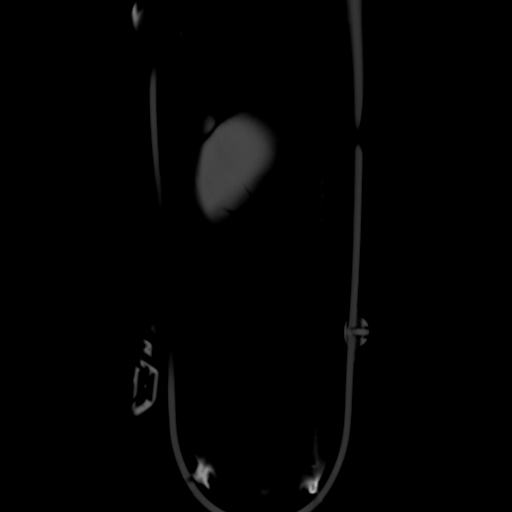
[im 20/87  bone]
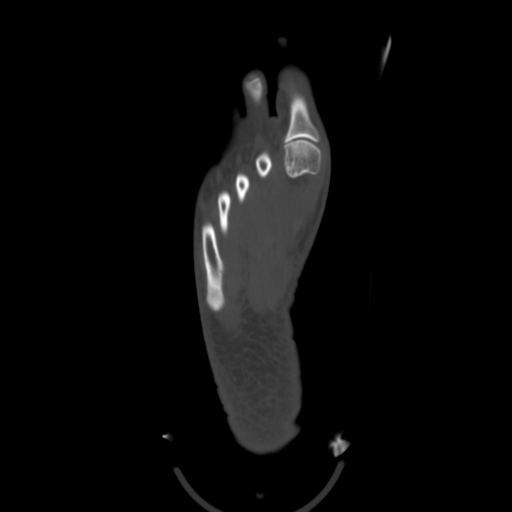
[im 27/87  bone]
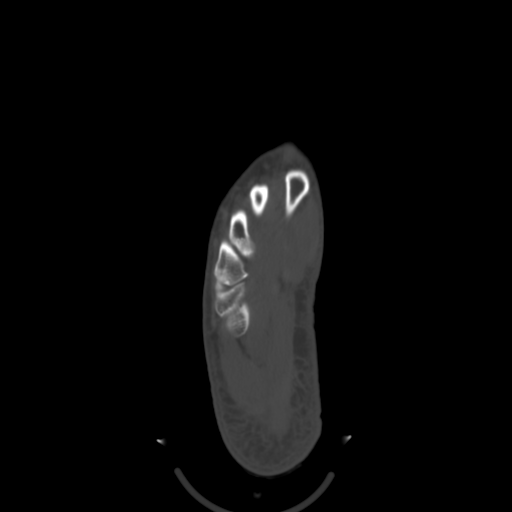
[im 40/87  bone]
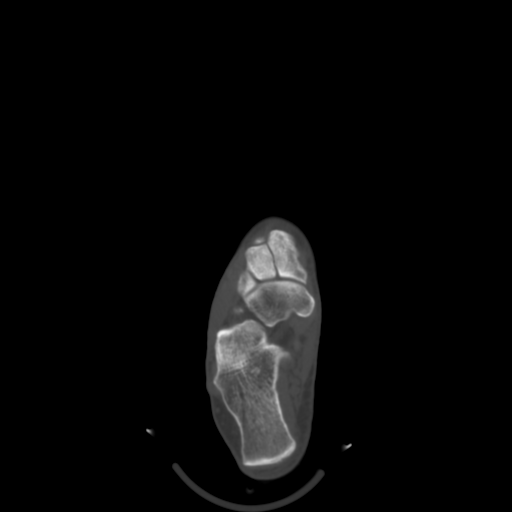
[im 47/87  soft-tissue]
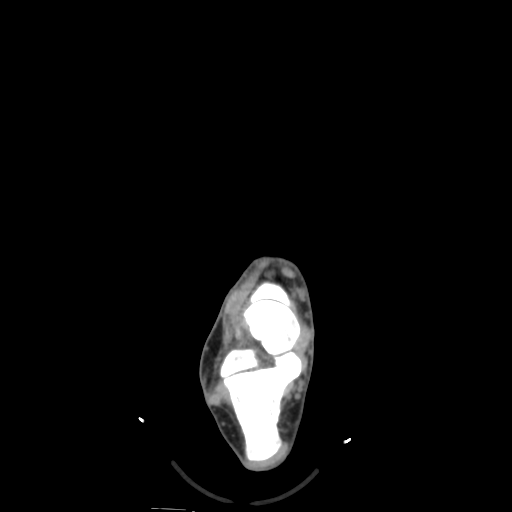
[im 47/87  bone]
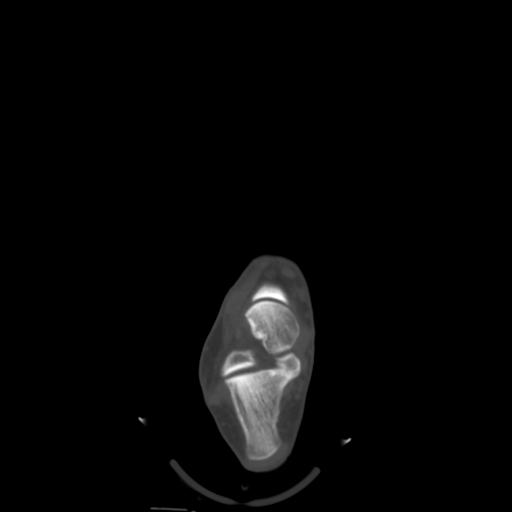
[im 60/87  bone]
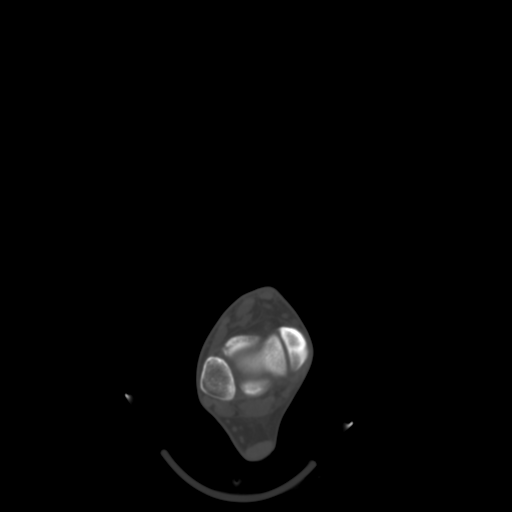
[im 67/87  bone]
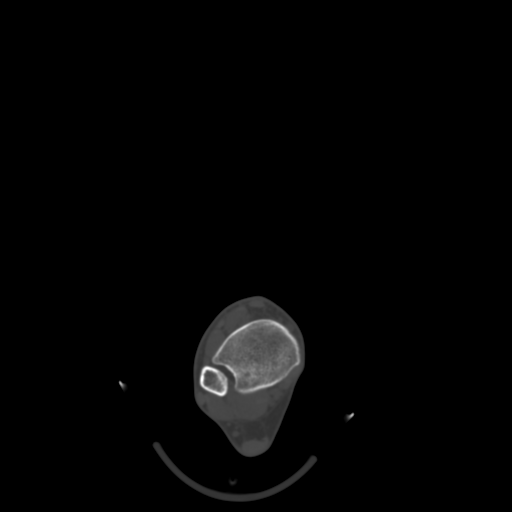
[im 80/87  bone]
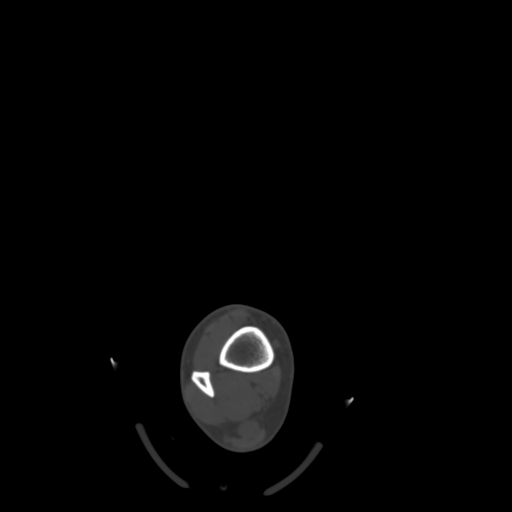

[Series 10: sagsoft tissue · sagittal · 0.33mm/px · 5 of 75 slices shown, 6 images]
[im 29/75  bone]
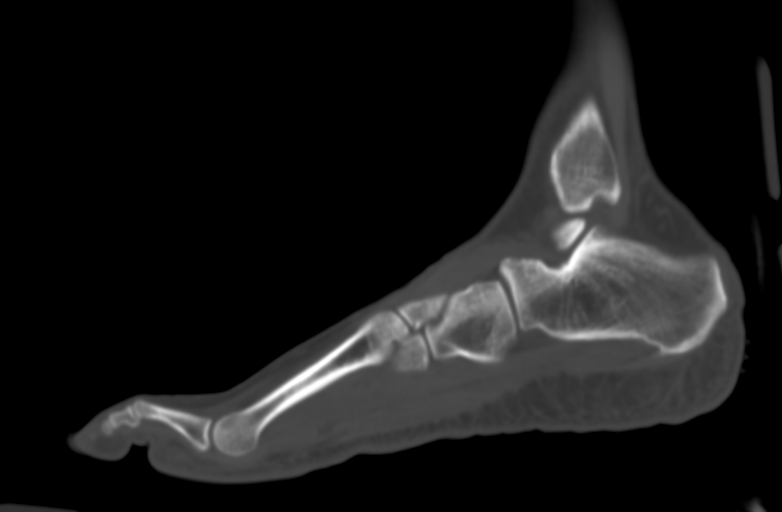
[im 38/75  bone]
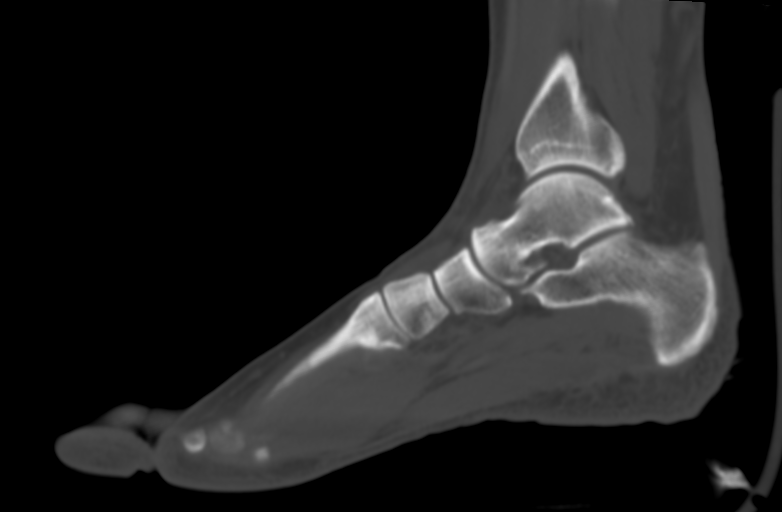
[im 47/75  bone]
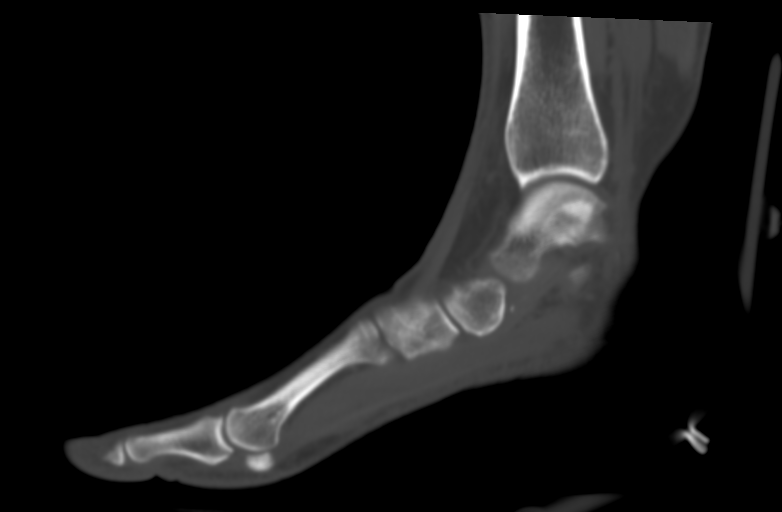
[im 56/75  soft-tissue]
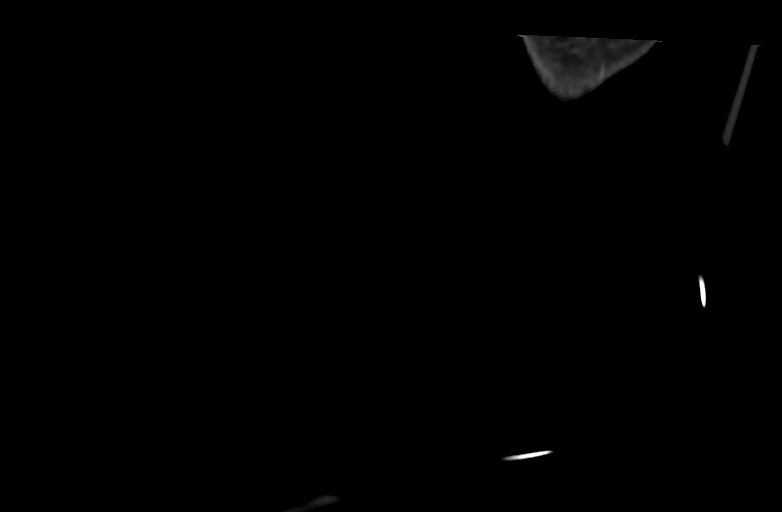
[im 56/75  bone]
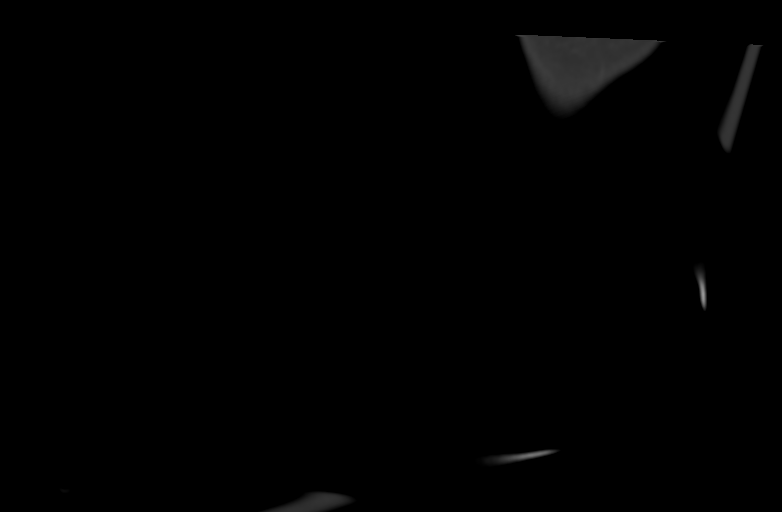
[im 65/75  bone]
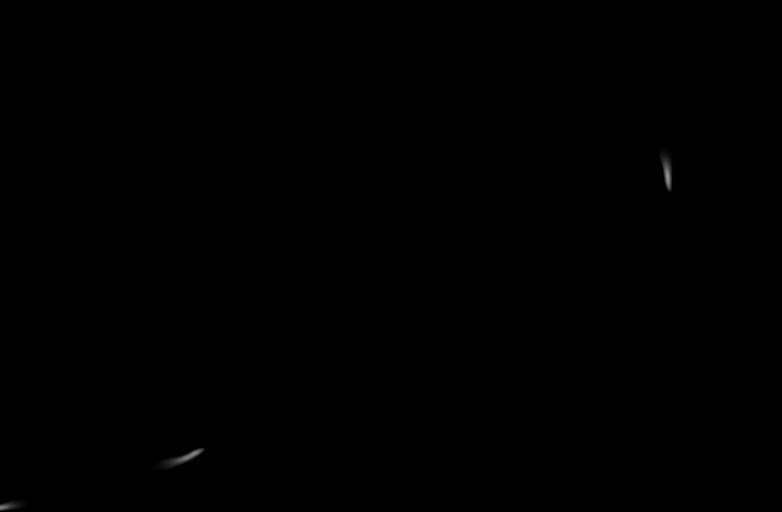

[13 of 29 positions shown; findings below may reference images not displayed]

FINDINGS: Bones/Joint/Cartilage

No acute fracture. No dislocation. Ankle mortise is congruent.
Tibiotalar and subtalar joint spaces are well preserved. No
appreciable joint effusion. Mild talonavicular subchondral sclerosis
without joint space loss. Mild osteoarthritis of the first MTP
joint. The remaining joint spaces within the midfoot and forefoot
are preserved. No erosion. Small plantar calcaneal spur.

Ligaments

Suboptimally assessed by CT.

Muscles and Tendons

Musculotendinous structures appear within normal limits by CT.

Soft tissues

Previously seen ganglion cyst at the anterolateral aspect of the
ankle appears less conspicuous by CT. No focal soft tissue swelling.
IMPRESSION: 1. No acute osseous abnormality of the right foot.
2. Mild osteoarthritis of the first MTP joint.
3. Previously seen ganglion cyst at the anterolateral aspect of the
ankle appears less conspicuous by CT.

## 2023-08-02 ENCOUNTER — Other Ambulatory Visit: Payer: Self-pay

## 2023-08-03 ENCOUNTER — Other Ambulatory Visit: Payer: Self-pay

## 2023-08-03 MED ORDER — ZEPBOUND 7.5 MG/0.5ML ~~LOC~~ SOAJ
7.5000 mg | SUBCUTANEOUS | 1 refills | Status: DC
  Filled 2023-08-03: qty 2, 28d supply, fill #0
  Filled 2023-09-28 (×2): qty 2, 28d supply, fill #1

## 2023-08-08 ENCOUNTER — Other Ambulatory Visit: Payer: Self-pay

## 2023-09-28 ENCOUNTER — Other Ambulatory Visit: Payer: Self-pay

## 2023-09-29 ENCOUNTER — Other Ambulatory Visit: Payer: Self-pay

## 2023-09-29 MED ORDER — ESCITALOPRAM OXALATE 20 MG PO TABS
20.0000 mg | ORAL_TABLET | Freq: Every day | ORAL | 1 refills | Status: DC
  Filled 2023-09-29: qty 90, 90d supply, fill #0
  Filled 2023-12-20: qty 90, 90d supply, fill #1

## 2023-10-03 ENCOUNTER — Other Ambulatory Visit: Payer: Self-pay

## 2023-11-02 ENCOUNTER — Other Ambulatory Visit: Payer: Self-pay

## 2023-11-02 MED ORDER — ZEPBOUND 7.5 MG/0.5ML ~~LOC~~ SOAJ
7.5000 mg | SUBCUTANEOUS | 1 refills | Status: DC
  Filled 2023-11-02 – 2023-11-14 (×2): qty 2, 28d supply, fill #0
  Filled 2023-12-19: qty 2, 28d supply, fill #1

## 2023-11-11 ENCOUNTER — Other Ambulatory Visit: Payer: Self-pay

## 2023-11-14 ENCOUNTER — Other Ambulatory Visit: Payer: Self-pay

## 2023-11-15 ENCOUNTER — Other Ambulatory Visit: Payer: Self-pay

## 2023-12-19 ENCOUNTER — Other Ambulatory Visit: Payer: Self-pay

## 2023-12-19 MED ORDER — ZOLPIDEM TARTRATE 5 MG PO TABS
5.0000 mg | ORAL_TABLET | Freq: Every day | ORAL | 1 refills | Status: AC
  Filled 2023-12-19: qty 90, 90d supply, fill #0
  Filled 2024-02-21 – 2024-03-26 (×2): qty 90, 90d supply, fill #1

## 2023-12-20 ENCOUNTER — Other Ambulatory Visit: Payer: Self-pay

## 2023-12-28 ENCOUNTER — Other Ambulatory Visit (HOSPITAL_COMMUNITY): Payer: Self-pay

## 2023-12-28 DIAGNOSIS — R350 Frequency of micturition: Secondary | ICD-10-CM | POA: Diagnosis not present

## 2023-12-28 DIAGNOSIS — R310 Gross hematuria: Secondary | ICD-10-CM | POA: Diagnosis not present

## 2023-12-28 DIAGNOSIS — R8271 Bacteriuria: Secondary | ICD-10-CM | POA: Diagnosis not present

## 2023-12-28 MED ORDER — CIPROFLOXACIN HCL 250 MG PO TABS
250.0000 mg | ORAL_TABLET | Freq: Two times a day (BID) | ORAL | 0 refills | Status: AC
  Filled 2023-12-28: qty 14, 7d supply, fill #0

## 2024-02-21 ENCOUNTER — Other Ambulatory Visit: Payer: Self-pay

## 2024-02-21 MED ORDER — ZEPBOUND 7.5 MG/0.5ML ~~LOC~~ SOAJ
7.5000 mg | SUBCUTANEOUS | 1 refills | Status: AC
  Filled 2024-02-21 – 2024-03-05 (×2): qty 2, 28d supply, fill #0
  Filled 2024-05-07: qty 2, 28d supply, fill #1

## 2024-02-23 ENCOUNTER — Other Ambulatory Visit: Payer: Self-pay

## 2024-03-05 ENCOUNTER — Other Ambulatory Visit: Payer: Self-pay

## 2024-03-14 ENCOUNTER — Other Ambulatory Visit: Payer: Self-pay

## 2024-03-21 DIAGNOSIS — R310 Gross hematuria: Secondary | ICD-10-CM | POA: Diagnosis not present

## 2024-03-21 DIAGNOSIS — Z8679 Personal history of other diseases of the circulatory system: Secondary | ICD-10-CM | POA: Diagnosis not present

## 2024-03-21 DIAGNOSIS — R6889 Other general symptoms and signs: Secondary | ICD-10-CM | POA: Diagnosis not present

## 2024-03-27 ENCOUNTER — Other Ambulatory Visit: Payer: Self-pay

## 2024-03-27 DIAGNOSIS — Z1331 Encounter for screening for depression: Secondary | ICD-10-CM | POA: Diagnosis not present

## 2024-03-27 DIAGNOSIS — Z01419 Encounter for gynecological examination (general) (routine) without abnormal findings: Secondary | ICD-10-CM | POA: Diagnosis not present

## 2024-04-02 ENCOUNTER — Other Ambulatory Visit: Payer: Self-pay

## 2024-04-02 DIAGNOSIS — L573 Poikiloderma of Civatte: Secondary | ICD-10-CM | POA: Diagnosis not present

## 2024-04-02 DIAGNOSIS — L814 Other melanin hyperpigmentation: Secondary | ICD-10-CM | POA: Diagnosis not present

## 2024-04-02 DIAGNOSIS — D220 Melanocytic nevi of lip: Secondary | ICD-10-CM | POA: Diagnosis not present

## 2024-04-02 DIAGNOSIS — L570 Actinic keratosis: Secondary | ICD-10-CM | POA: Diagnosis not present

## 2024-04-02 DIAGNOSIS — D2239 Melanocytic nevi of other parts of face: Secondary | ICD-10-CM | POA: Diagnosis not present

## 2024-04-02 MED ORDER — FLUOROURACIL 5 % EX CREA
1.0000 | TOPICAL_CREAM | Freq: Two times a day (BID) | CUTANEOUS | 0 refills | Status: AC
  Filled 2024-04-02: qty 40, 30d supply, fill #0

## 2024-04-05 ENCOUNTER — Other Ambulatory Visit: Payer: Self-pay

## 2024-04-05 DIAGNOSIS — Z1211 Encounter for screening for malignant neoplasm of colon: Secondary | ICD-10-CM | POA: Diagnosis not present

## 2024-04-05 DIAGNOSIS — Z1212 Encounter for screening for malignant neoplasm of rectum: Secondary | ICD-10-CM | POA: Diagnosis not present

## 2024-04-10 ENCOUNTER — Other Ambulatory Visit (HOSPITAL_COMMUNITY): Payer: Self-pay

## 2024-04-10 LAB — COLOGUARD: COLOGUARD: NEGATIVE

## 2024-04-11 ENCOUNTER — Other Ambulatory Visit (HOSPITAL_COMMUNITY): Payer: Self-pay

## 2024-04-11 MED ORDER — ZEPBOUND 7.5 MG/0.5ML ~~LOC~~ SOAJ
7.5000 mg | SUBCUTANEOUS | 5 refills | Status: AC
  Filled 2024-04-11: qty 2, 28d supply, fill #0
  Filled 2024-06-06 – 2024-07-11 (×2): qty 2, 28d supply, fill #1

## 2024-04-13 ENCOUNTER — Other Ambulatory Visit: Payer: Self-pay

## 2024-04-16 ENCOUNTER — Other Ambulatory Visit: Payer: Self-pay

## 2024-04-16 MED ORDER — ESCITALOPRAM OXALATE 20 MG PO TABS
20.0000 mg | ORAL_TABLET | Freq: Every day | ORAL | 1 refills | Status: AC
  Filled 2024-04-16 – 2024-06-18 (×2): qty 90, 90d supply, fill #0

## 2024-04-26 ENCOUNTER — Other Ambulatory Visit: Payer: Self-pay

## 2024-05-07 ENCOUNTER — Other Ambulatory Visit: Payer: Self-pay

## 2024-05-07 NOTE — Progress Notes (Deleted)
 Cardiology Office Note:    Date:  05/07/2024   ID:  [name], DOB [birth year], MRN [id number]  PCP:  Royden Ronal Czar, FNP   [facility] Providers Cardiologist:  None { Click to update primary MD,subspecialty MD or APP then REFRESH:1}    Referring MD: Royden Ronal Czar, FNP   No chief complaint on file. ***  History of Present Illness:    CATHA ONTKO is a 43 y.o. female seen at the request of Ronal Royden FNP for evaluation of exercise intolerance. She has a history of PVCs.   Past Medical History:  Diagnosis Date   ADHD    Anemia    Anxiety    Postpartum care following vaginal delivery 02/06/2013   Preterm labor    PVC's (premature ventricular contractions)    VBAC, delivered, current hospitalization 02/06/2013    Past Surgical History:  Procedure Laterality Date   CESAREAN SECTION  06/23/2011   Procedure: CESAREAN SECTION;  Surgeon: Truman Corona;  Location: [facility];  Service: Gynecology;  Laterality: N/A;   DILATION AND CURETTAGE OF UTERUS     WISDOM TOOTH EXTRACTION      Current Medications: No outpatient medications have been marked as taking for the 05/10/24 encounter (Appointment) with Khristen Cheyney M, MD.     Allergies:   Patient has no known allergies.   Social History   Socioeconomic History   Marital status: Married    Spouse name: Not on file   Number of children: Not on file   Years of education: Not on file   Highest education level: Not on file  Occupational History   Not on file  Tobacco Use   Smoking status: Never   Smokeless tobacco: Never  Vaping Use   Vaping status: Never Used  Substance and Sexual Activity   Alcohol use: No   Drug use: No   Sexual activity: Never    Birth control/protection: None  Other Topics Concern   Not on file  Social History Narrative   Lives in [place]   Works as a SECONDARY SCHOOL TEACHER in [employer]   Social Drivers of Corporate Investment Banker Strain: Not on file  Food Insecurity:  Not on file  Transportation Needs: Not on file  Physical Activity: Not on file  Stress: Not on file  Social Connections: Not on file     Family History: The patient's ***family history includes Alcohol abuse in her mother; Depression in her maternal uncle, mother, and paternal aunt; Diabetes in her father; Heart attack in her father; Heart disease in her father; Hyperlipidemia in her father; Hypertension in her father; Kidney disease in her maternal uncle; Mental illness in her maternal grandmother, maternal uncle, and mother; Sudden death in her paternal grandfather.  ROS:   Please see the history of present illness.    *** All other systems reviewed and are negative.  EKGs/Labs/Other Studies Reviewed:    The following studies were reviewed today: Echo 07/29/19: IMPRESSIONS     1. Left ventricular ejection fraction, by visual estimation, is 65 to  70%. The left ventricle has normal function. There is no left ventricular  hypertrophy.   2. The left ventricle has no regional wall motion abnormalities.   3. Global right ventricle has normal systolic function.The right  ventricular size is normal. No increase in right ventricular wall  thickness.   4. Left atrial size was normal.   5. Right atrial size was normal.   6. Trivial pericardial effusion is  present.   7. The mitral valve is normal in structure. Trivial mitral valve  regurgitation.   8. The tricuspid valve is normal in structure.   9. The tricuspid valve is normal in structure. Tricuspid valve  regurgitation is trivial.  10. The aortic valve is normal in structure. Aortic valve regurgitation is  trivial.  11. The pulmonic valve was grossly normal. Pulmonic valve regurgitation is  trivial.  12. Normal pulmonary artery systolic pressure.  13. The inferior vena cava is normal in size with greater than 50%  respiratory variability, suggesting right atrial pressure of 3 mmHg.        Recent Labs: No results found for  requested labs within last 365 days.  Recent Lipid Panel No results found for: CHOL, TRIG, HDL, CHOLHDL, VLDL, LDLCALC, LDLDIRECT   Risk Assessment/Calculations:   {Does this patient have ATRIAL FIBRILLATION?:[phone]}  No BP recorded.  {Refresh Note OR Click here to enter BP  :1}***         Physical Exam:    VS:  There were no vitals taken for this visit.    Wt Readings from Last 3 Encounters:  10/30/19 196 lb 1.6 oz (89 kg)  08/14/19 180 lb (81.6 kg)  07/29/19 178 lb (80.7 kg)     GEN: *** Well nourished, well developed in no acute distress HEENT: Normal NECK: No JVD; No carotid bruits LYMPHATICS: No lymphadenopathy CARDIAC: ***RRR, no murmurs, rubs, gallops RESPIRATORY:  Clear to auscultation without rales, wheezing or rhonchi  ABDOMEN: Soft, non-tender, non-distended MUSCULOSKELETAL:  No edema; No deformity  SKIN: Warm and dry NEUROLOGIC:  Alert and oriented x 3 PSYCHIATRIC:  Normal affect   ASSESSMENT:    No diagnosis found. PLAN:    In order of problems listed above:  ***      {Are you ordering a CV Procedure (e.g. stress test, cath, DCCV, TEE, etc)?   Press F2        :[id number]}    Medication Adjustments/Labs and Tests Ordered: Current medicines are reviewed at length with the patient today.  Concerns regarding medicines are outlined above.  No orders of the defined types were placed in this encounter.  No orders of the defined types were placed in this encounter.   There are no Patient Instructions on file for this visit.   Signed, Zanylah Hardie, MD  05/07/2024 7:46 AM    [facility]

## 2024-05-10 ENCOUNTER — Ambulatory Visit: Admitting: Cardiology

## 2024-05-15 ENCOUNTER — Other Ambulatory Visit: Payer: Self-pay

## 2024-05-16 ENCOUNTER — Other Ambulatory Visit: Payer: Self-pay

## 2024-06-06 ENCOUNTER — Other Ambulatory Visit: Payer: Self-pay

## 2024-06-06 DIAGNOSIS — Z0000 Encounter for general adult medical examination without abnormal findings: Secondary | ICD-10-CM | POA: Diagnosis not present

## 2024-06-06 DIAGNOSIS — R6889 Other general symptoms and signs: Secondary | ICD-10-CM | POA: Diagnosis not present

## 2024-06-06 DIAGNOSIS — Z8679 Personal history of other diseases of the circulatory system: Secondary | ICD-10-CM | POA: Diagnosis not present

## 2024-06-06 DIAGNOSIS — R310 Gross hematuria: Secondary | ICD-10-CM | POA: Diagnosis not present

## 2024-06-19 ENCOUNTER — Other Ambulatory Visit: Payer: Self-pay

## 2024-06-20 ENCOUNTER — Other Ambulatory Visit: Payer: Self-pay

## 2024-07-04 ENCOUNTER — Other Ambulatory Visit: Payer: Self-pay | Admitting: Registered Nurse

## 2024-07-04 ENCOUNTER — Ambulatory Visit

## 2024-07-04 DIAGNOSIS — Z1231 Encounter for screening mammogram for malignant neoplasm of breast: Secondary | ICD-10-CM

## 2024-07-10 NOTE — Progress Notes (Unsigned)
 " Cardiology Office Note:    Date:  07/10/2024   ID:  Joyce Bauer, DOB 09-Apr-1981, MRN [id number]  PCP:  Royden Ronal Czar, FNP   [facility] Providers Cardiologist:  None { Click to update primary MD,subspecialty MD or APP then REFRESH:1}    Referring MD: Royden Ronal Czar, FNP   No chief complaint on file. ***  History of Present Illness:    Joyce Bauer is a 44 y.o. female with a hx of PVCs seen for evaluation of exercise intolerance. Seem remotely by EP. PVCs noted mostly during pregnancy. Never required treatment. Echo in 2021 was normal.   Past Medical History:  Diagnosis Date   ADHD    Anemia    Anxiety    Postpartum care following vaginal delivery 02/06/2013   Preterm labor    PVC's (premature ventricular contractions)    VBAC, delivered, current hospitalization 02/06/2013    Past Surgical History:  Procedure Laterality Date   CESAREAN SECTION  06/23/2011   Procedure: CESAREAN SECTION;  Surgeon: Truman Corona;  Location: [facility];  Service: Gynecology;  Laterality: N/A;   DILATION AND CURETTAGE OF UTERUS     WISDOM TOOTH EXTRACTION      Current Medications: Active Medications[1]   Allergies:   Patient has no known allergies.   Social History   Socioeconomic History   Marital status: Married    Spouse name: Not on file   Number of children: Not on file   Years of education: Not on file   Highest education level: Not on file  Occupational History   Not on file  Tobacco Use   Smoking status: Never   Smokeless tobacco: Never  Vaping Use   Vaping status: Never Used  Substance and Sexual Activity   Alcohol use: No   Drug use: No   Sexual activity: Never    Birth control/protection: None  Other Topics Concern   Not on file  Social History Narrative   Lives in [place]   Works as a SECONDARY SCHOOL TEACHER in [employer]   Social Drivers of Health   Tobacco Use: Low Risk (05/07/2024)   Patient History    Smoking Tobacco Use: Never    Smokeless  Tobacco Use: Never    Passive Exposure: Not on file  Financial Resource Strain: Not on file  Food Insecurity: Not on file  Transportation Needs: Not on file  Physical Activity: Not on file  Stress: Not on file  Social Connections: Not on file  Depression (EYV7-0): Not on file  Alcohol Screen: Not on file  Housing: Not on file  Utilities: Not on file  Health Literacy: Not on file     Family History: The patient's ***family history includes Alcohol abuse in her mother; Depression in her maternal uncle, mother, and paternal aunt; Diabetes in her father; Heart attack in her father; Heart disease in her father; Hyperlipidemia in her father; Hypertension in her father; Kidney disease in her maternal uncle; Mental illness in her maternal grandmother, maternal uncle, and mother; Sudden death in her paternal grandfather.  ROS:   Please see the history of present illness.    *** All other systems reviewed and are negative.  EKGs/Labs/Other Studies Reviewed:    The following studies were reviewed today: Echo 07/29/19: IMPRESSIONS     1. Left ventricular ejection fraction, by visual estimation, is 65 to  70%. The left ventricle has normal function. There is no left ventricular  hypertrophy.   2. The left ventricle has  no regional wall motion abnormalities.   3. Global right ventricle has normal systolic function.The right  ventricular size is normal. No increase in right ventricular wall  thickness.   4. Left atrial size was normal.   5. Right atrial size was normal.   6. Trivial pericardial effusion is present.   7. The mitral valve is normal in structure. Trivial mitral valve  regurgitation.   8. The tricuspid valve is normal in structure.   9. The tricuspid valve is normal in structure. Tricuspid valve  regurgitation is trivial.  10. The aortic valve is normal in structure. Aortic valve regurgitation is  trivial.  11. The pulmonic valve was grossly normal. Pulmonic valve  regurgitation is  trivial.  12. Normal pulmonary artery systolic pressure.  13. The inferior vena cava is normal in size with greater than 50%  respiratory variability, suggesting right atrial pressure of 3 mmHg.        Recent Labs: No results found for requested labs within last 365 days.  Recent Lipid Panel No results found for: CHOL, TRIG, HDL, CHOLHDL, VLDL, LDLCALC, LDLDIRECT   Risk Assessment/Calculations:   {Does this patient have ATRIAL FIBRILLATION?:[phone]}  No BP recorded.  {Refresh Note OR Click here to enter BP  :1}***         Physical Exam:    VS:  There were no vitals taken for this visit.    Wt Readings from Last 3 Encounters:  10/30/19 196 lb 1.6 oz (89 kg)  08/14/19 180 lb (81.6 kg)  07/29/19 178 lb (80.7 kg)     GEN: *** Well nourished, well developed in no acute distress HEENT: Normal NECK: No JVD; No carotid bruits LYMPHATICS: No lymphadenopathy CARDIAC: ***RRR, no murmurs, rubs, gallops RESPIRATORY:  Clear to auscultation without rales, wheezing or rhonchi  ABDOMEN: Soft, non-tender, non-distended MUSCULOSKELETAL:  No edema; No deformity  SKIN: Warm and dry NEUROLOGIC:  Alert and oriented x 3 PSYCHIATRIC:  Normal affect   ASSESSMENT:    No diagnosis found. PLAN:    In order of problems listed above:  ***      {Are you ordering a CV Procedure (e.g. stress test, cath, DCCV, TEE, etc)?   Press F2        :[id number]}    Medication Adjustments/Labs and Tests Ordered: Current medicines are reviewed at length with the patient today.  Concerns regarding medicines are outlined above.  No orders of the defined types were placed in this encounter.  No orders of the defined types were placed in this encounter.   There are no Patient Instructions on file for this visit.   Signed, Lexii Walsh, MD  07/10/2024 4:02 PM    [facility]     [1]  No outpatient medications have been marked as taking for the 07/13/24  encounter (Appointment) with Eloy Fehl M, MD.   "

## 2024-07-12 ENCOUNTER — Other Ambulatory Visit: Payer: Self-pay

## 2024-07-13 ENCOUNTER — Ambulatory Visit: Admitting: Cardiology

## 2024-07-17 ENCOUNTER — Ambulatory Visit
Admission: RE | Admit: 2024-07-17 | Discharge: 2024-07-17 | Disposition: A | Source: Ambulatory Visit | Attending: Registered Nurse | Admitting: Registered Nurse

## 2024-07-17 DIAGNOSIS — Z1231 Encounter for screening mammogram for malignant neoplasm of breast: Secondary | ICD-10-CM

## 2024-08-01 ENCOUNTER — Ambulatory Visit: Admitting: Cardiology
